# Patient Record
Sex: Female | Born: 1960 | Race: White | Hispanic: No | Smoking: Former smoker
Health system: Southern US, Community
[De-identification: ages and names within clinical notes are randomized; demographics above are authoritative.]

## PROBLEM LIST (undated history)

## (undated) DIAGNOSIS — F329 Major depressive disorder, single episode, unspecified: Secondary | ICD-10-CM

## (undated) DIAGNOSIS — G4730 Sleep apnea, unspecified: Secondary | ICD-10-CM

## (undated) DIAGNOSIS — E039 Hypothyroidism, unspecified: Secondary | ICD-10-CM

## (undated) DIAGNOSIS — F32A Depression, unspecified: Secondary | ICD-10-CM

## (undated) DIAGNOSIS — I10 Essential (primary) hypertension: Secondary | ICD-10-CM

## (undated) DIAGNOSIS — K219 Gastro-esophageal reflux disease without esophagitis: Secondary | ICD-10-CM

## (undated) DIAGNOSIS — E079 Disorder of thyroid, unspecified: Secondary | ICD-10-CM

## (undated) HISTORY — PX: OTHER SURGICAL HISTORY: SHX169

## (undated) HISTORY — PX: CHOLECYSTECTOMY: SHX55

## (undated) HISTORY — PX: TUBAL LIGATION: SHX77

## (undated) HISTORY — PX: CERVICAL FUSION: SHX112

---

## 2005-05-07 ENCOUNTER — Ambulatory Visit: Payer: Self-pay | Admitting: Orthopaedic Surgery

## 2006-06-06 ENCOUNTER — Emergency Department: Payer: Self-pay | Admitting: Emergency Medicine

## 2008-07-11 ENCOUNTER — Inpatient Hospital Stay: Payer: Self-pay | Admitting: General Surgery

## 2009-01-31 ENCOUNTER — Emergency Department: Payer: Self-pay | Admitting: Emergency Medicine

## 2009-09-04 ENCOUNTER — Ambulatory Visit: Payer: Self-pay

## 2009-09-18 ENCOUNTER — Ambulatory Visit: Payer: Self-pay | Admitting: Orthopedic Surgery

## 2009-09-24 ENCOUNTER — Ambulatory Visit: Payer: Self-pay | Admitting: Orthopedic Surgery

## 2009-10-04 ENCOUNTER — Ambulatory Visit: Payer: Self-pay | Admitting: Cardiovascular Disease

## 2011-03-31 ENCOUNTER — Emergency Department: Payer: Self-pay | Admitting: Emergency Medicine

## 2011-04-02 ENCOUNTER — Ambulatory Visit: Payer: Self-pay | Admitting: Orthopedic Surgery

## 2011-04-16 ENCOUNTER — Ambulatory Visit: Payer: Self-pay | Admitting: Unknown Physician Specialty

## 2011-04-16 LAB — HEMOGLOBIN: HGB: 15.1 g/dL (ref 12.0–16.0)

## 2011-04-16 LAB — BASIC METABOLIC PANEL
Anion Gap: 7 (ref 7–16)
Calcium, Total: 9.4 mg/dL (ref 8.5–10.1)
Creatinine: 0.79 mg/dL (ref 0.60–1.30)
EGFR (African American): >60
Glucose: 82 mg/dL (ref 65–99)
Potassium: 4.5 mmol/L (ref 3.5–5.1)

## 2011-04-28 ENCOUNTER — Ambulatory Visit: Payer: Self-pay | Admitting: Unknown Physician Specialty

## 2011-09-20 ENCOUNTER — Encounter (HOSPITAL_COMMUNITY): Payer: Self-pay | Admitting: *Deleted

## 2011-09-20 ENCOUNTER — Emergency Department (HOSPITAL_COMMUNITY)
Admission: EM | Admit: 2011-09-20 | Discharge: 2011-09-20 | Disposition: A | Discharge status: Home / Self Care | Payer: Private Health Insurance - Indemnity | Attending: Emergency Medicine | Admitting: Emergency Medicine

## 2011-09-20 DIAGNOSIS — Z981 Arthrodesis status: Secondary | ICD-10-CM | POA: Insufficient documentation

## 2011-09-20 DIAGNOSIS — R609 Edema, unspecified: Secondary | ICD-10-CM | POA: Insufficient documentation

## 2011-09-20 DIAGNOSIS — M79609 Pain in unspecified limb: Secondary | ICD-10-CM | POA: Insufficient documentation

## 2011-09-20 DIAGNOSIS — E079 Disorder of thyroid, unspecified: Secondary | ICD-10-CM | POA: Insufficient documentation

## 2011-09-20 DIAGNOSIS — R600 Localized edema: Secondary | ICD-10-CM

## 2011-09-20 DIAGNOSIS — F17200 Nicotine dependence, unspecified, uncomplicated: Secondary | ICD-10-CM | POA: Insufficient documentation

## 2011-09-20 HISTORY — DX: Disorder of thyroid, unspecified: E07.9

## 2011-09-20 LAB — POCT I-STAT, CHEM 8
BUN: 18 mg/dL (ref 6–23)
Calcium, Ion: 1.22 mmol/L (ref 1.12–1.23)
HCT: 44 % (ref 36.0–46.0)
Hemoglobin: 15 g/dL (ref 12.0–15.0)
Sodium: 141 mEq/L (ref 135–145)
TCO2: 24 mmol/L (ref 0–100)

## 2011-09-20 NOTE — ED Notes (Signed)
PT reports leg swelling new onset. Pt reports she stands for long hrs at her job and the swelling started on Friday. The swelling decreased while sleeping but returned soon after waking up this AM.

## 2011-09-20 NOTE — ED Provider Notes (Signed)
History  This chart was scribed for Ethelda Chick, MD by Ladona Ridgel Day. This patient was seen in room TR02C/TR02C and the patient's care was started at 1238.   CSN: [id number]  Arrival date & time 09/20/11  1238   None     Chief Complaint  Patient presents with  . Leg Swelling  . Leg Pain   The history is provided by the patient. No language interpreter was used.   Marihanna Betschart is a 51 y.o. female who presents to the Emergency Department complaining of intermittent gradually worsening BLE leg swelling/pain for 2 days. She states this is a new problem which began 2 days ago while she was standing for a long time at work and her BLE began swelling/pain. Her swelling decreased after sleep and returned after waking up this AM. She states sometimes has mild swelling but never this severe before. She denies SOB, kidney disease, no chest pain.  No recent travel or surgeries, no OCP use, no hx DVT/PE.  There are no other associated systemic symptoms, there are no other alleviating or modifying factors.   Her PCP is Dr. Kenard Gower  Past Medical History  Diagnosis Date  . Thyroid disease     Past Surgical History  Procedure Date  . Cervical fusion     april 2013  . Tubal ligation   . Arthoscopic knee     BIL    No family history on file.  History  Substance Use Topics  . Smoking status: Current Every Day Smoker    Types: Cigarettes  . Smokeless tobacco: Not on file  . Alcohol Use: No    OB History    Grav Para Term Preterm Abortions TAB SAB Ect Mult Living                  Review of Systems  Constitutional: Negative for fever and chills.  HENT: Negative for congestion.   Respiratory: Negative for cough and shortness of breath.   Cardiovascular: Positive for leg swelling. Negative for chest pain.  Gastrointestinal: Negative for nausea, vomiting and abdominal pain.  Musculoskeletal: Negative for back pain.  Skin: Negative for color change.  Neurological: Negative for  weakness.  All other systems reviewed and are negative.    Allergies  Review of patient's allergies indicates no known allergies.  Home Medications   Current Outpatient Rx  Name Route Sig Dispense Refill  . FP GLUCOSAMINE PO Oral Take 2 tablets by mouth daily.    . IBUPROFEN 800 MG PO TABS Oral Take 800 mg by mouth every 8 (eight) hours as needed. pain    . LEVOTHYROXINE SODIUM 112 MCG PO TABS Oral Take 112 mcg by mouth daily.      Triage Vitals: BP 130/84  Pulse 79  Temp 97.6 F (36.4 C) (Oral)  Resp 20  SpO2 99%  Physical Exam  Nursing note and vitals reviewed. Constitutional: She is oriented to person, place, and time. She appears well-developed and well-nourished. No distress.  HENT:  Head: Normocephalic and atraumatic.  Eyes: EOM are normal.  Neck: Neck supple. No tracheal deviation present.  Cardiovascular: Normal rate, regular rhythm and normal heart sounds.   No murmur heard. Pulmonary/Chest: Effort normal and breath sounds normal. No respiratory distress. She has no wheezes. She has no rales.  Musculoskeletal: Normal range of motion. She exhibits edema (BLE edema, non pitting, symetric, no calf tenderness).  Neurological: She is alert and oriented to person, place, and time.  Skin: Skin is  warm and dry.  Psychiatric: She has a normal mood and affect. Her behavior is normal.    ED Course  Procedures (including critical care time) DIAGNOSTIC STUDIES: Oxygen Saturation is 99% on room air, normal by my interpretation.    COORDINATION OF CARE: At 28 PM Discussed treatment plan with patient which includes blood work, and follow up with her PCP. Patient agrees.    Labs Reviewed  POCT I-STAT, CHEM 8  LAB REPORT - SCANNED   No results found.   1. Lower extremity edema       MDM  Pt with symmetric bilateral lower extremity nonpitting edema.  States it is worse with standing on her feet for long periods.  Renal function normal, no signs of CHF.  Doubt DVT  given symmetry and no other risk factors.  Advised pt for f/u with her PMD.  Discharged with strict return precautions.  Pt agreeable with plan.  I personally performed the services described in this documentation, which was scribed in my presence. The recorded information has been reviewed and considered.          Ethelda Chick, MD 09/23/11 [phone]

## 2011-10-15 ENCOUNTER — Emergency Department: Payer: Self-pay | Admitting: Emergency Medicine

## 2011-10-15 LAB — CBC
HCT: 41.5 % (ref 35.0–47.0)
MCHC: 33.4 g/dL (ref 32.0–36.0)
MCV: 94 fL (ref 80–100)
Platelet: 230 10*3/uL (ref 150–440)
RDW: 14.2 % (ref 11.5–14.5)

## 2011-10-15 LAB — CK TOTAL AND CKMB (NOT AT ARMC)
CK, Total: 232 U/L — ABNORMAL HIGH (ref 21–215)
CK-MB: 4.3 ng/mL — ABNORMAL HIGH (ref 0.5–3.6)

## 2011-10-15 LAB — COMPREHENSIVE METABOLIC PANEL
BUN: 17 mg/dL (ref 7–18)
Chloride: 109 mmol/L — ABNORMAL HIGH (ref 98–107)
Co2: 21 mmol/L (ref 21–32)
EGFR (African American): >60
EGFR (Non-African Amer.): >60
SGOT(AST): 26 U/L (ref 15–37)
SGPT (ALT): 26 U/L (ref 12–78)

## 2011-10-15 LAB — TROPONIN I
Troponin-I: <0.02 ng/mL
Troponin-I: <0.02 ng/mL

## 2012-08-09 ENCOUNTER — Emergency Department: Payer: Self-pay | Admitting: Emergency Medicine

## 2012-08-09 LAB — URINALYSIS, COMPLETE
Bilirubin,UR: NEGATIVE
Ketone: NEGATIVE
Ph: 6 (ref 4.5–8.0)
Protein: NEGATIVE
RBC,UR: 1 /HPF (ref 0–5)
Specific Gravity: 1.014 (ref 1.003–1.030)

## 2012-08-09 LAB — COMPREHENSIVE METABOLIC PANEL
Albumin: 3.4 g/dL (ref 3.4–5.0)
BUN: 30 mg/dL — ABNORMAL HIGH (ref 7–18)
Calcium, Total: 8.7 mg/dL (ref 8.5–10.1)
Creatinine: 0.95 mg/dL (ref 0.60–1.30)
Osmolality: 282 (ref 275–301)
SGOT(AST): 33 U/L (ref 15–37)
SGPT (ALT): 35 U/L (ref 12–78)

## 2012-08-09 LAB — CBC
HCT: 41.5 % (ref 35.0–47.0)
HGB: 14.3 g/dL (ref 12.0–16.0)
RBC: 4.44 10*6/uL (ref 3.80–5.20)
WBC: 10.4 10*3/uL (ref 3.6–11.0)

## 2012-10-17 ENCOUNTER — Ambulatory Visit: Payer: Self-pay | Admitting: Gastroenterology

## 2013-09-20 DIAGNOSIS — G4730 Sleep apnea, unspecified: Secondary | ICD-10-CM | POA: Insufficient documentation

## 2013-10-09 ENCOUNTER — Ambulatory Visit: Payer: Self-pay | Admitting: Family Medicine

## 2014-03-07 DIAGNOSIS — R600 Localized edema: Secondary | ICD-10-CM | POA: Insufficient documentation

## 2014-04-06 DIAGNOSIS — I10 Essential (primary) hypertension: Secondary | ICD-10-CM | POA: Insufficient documentation

## 2014-04-06 DIAGNOSIS — E039 Hypothyroidism, unspecified: Secondary | ICD-10-CM | POA: Insufficient documentation

## 2014-04-09 ENCOUNTER — Emergency Department
Admit: 2014-04-09 | Disposition: A | Discharge status: Home / Self Care | Payer: Self-pay | Admitting: Emergency Medicine

## 2014-04-09 LAB — BASIC METABOLIC PANEL
Anion Gap: 10 (ref 7–16)
BUN: 26 mg/dL — AB
CALCIUM: 9.6 mg/dL
CHLORIDE: 102 mmol/L
CO2: 24 mmol/L
CREATININE: 0.97 mg/dL
EGFR (Non-African Amer.): >60
GLUCOSE: 97 mg/dL
Potassium: 3.7 mmol/L
SODIUM: 136 mmol/L

## 2014-04-09 LAB — TROPONIN I

## 2014-04-09 LAB — CBC
HCT: 39.2 % (ref 35.0–47.0)
HGB: 12.9 g/dL (ref 12.0–16.0)
MCH: 31.7 pg (ref 26.0–34.0)
MCHC: 32.9 g/dL (ref 32.0–36.0)
MCV: 96 fL (ref 80–100)
Platelet: 231 10*3/uL (ref 150–440)
RBC: 4.06 10*6/uL (ref 3.80–5.20)
RDW: 13.9 % (ref 11.5–14.5)
WBC: 8.8 10*3/uL (ref 3.6–11.0)

## 2014-04-10 DIAGNOSIS — R079 Chest pain, unspecified: Secondary | ICD-10-CM | POA: Insufficient documentation

## 2014-04-29 NOTE — Op Note (Signed)
PATIENT NAME:  Allison Paul, HEPLER MR#:  [id number] DATE OF BIRTH:  03-24-1960  DATE OF PROCEDURE:  04/28/2011  PREOPERATIVE DIAGNOSIS: Cervical spondylosis with radiculopathy, right arm C5-C6.   POSTOPERATIVE DIAGNOSIS: Cervical spondylosis with radiculopathy, right arm C5-C6.   PROCEDURES: 1. Anterior cervical diskectomy and fusion, C5-C6 with removal of posterior osteophytes and posterior disk material.  2. Application of interbody device, C5-C6.  3. Application of anterior plate C5-C6.   SURGEON: Alysia Penna. Mauri Pole, MD  ASSISTANT: Rachelle Hora, PA-C  ANESTHESIA: General.   ESTIMATED BLOOD LOSS: 25 mL.   REPLACEMENT: 1500 mL of crystalloid.   DRAINS: One Hemovac.   COMPLICATIONS: None.   IMPLANTS USED: Zimmer trabecular metal angled 6 x 11 x 11 mm implant, 22 mm Trinica plate, 12 mm screws, CopiOs bone void filler.   BRIEF CLINICAL NOTE AND PATHOLOGY: The patient had a documented large cervical herniated disk with osteophytes and spondylosis, C6 radiculopathy with weakness. She had some improvement with conservative treatment but had continued persistent pain and weakness. Options, risks, and benefits were discussed and she elected to proceed with operative intervention. At time of the procedure there was a very large posterior fragment. Patient's bone was of good quality.   DESCRIPTION OF PROCEDURE: Preop antibiotics, adequate general anesthesia, patient was on the radiolucent table, head halter traction used, roll placed beneath the cervical spine. Shoulder positioner was used. AP and lateral fluoroscopic views were checked. The skin was marked and the routine left-sided incision was made. The subcutaneous tissues were dissected, the interval carefully developed down to the anterior cervical spine with the fascia being released. The self-retaining retractor was placed. It was repositioned frequently throughout the procedure.   Position was checked with lateral fluoroscopy and the  distraction pins were placed in the C6 and C7 vertebral bodies. The microscope was then brought onto the field and the anterior annulus was excised followed by the diskectomy. This was completed to the posterior disk space and posterior osteophytes were removed. The endplates were scraped and the bur was used. The posterior osteophytes again checked with the rongeur. Sizing was performed after the lateral gutters were checked. The posterior longitudinal ligament had been removed. Incision was thoroughly irrigated. The size 5 and size 6 spacers were trialed. This resulted in a size 6. It was then inserted after being packed with CopiOs. Surgiflo was placed prior to this. AP and lateral views showed good positioning. The anterior plate was then applied, screws had excellent purchase. The locking mechanisms were engaged. AP and lateral views showed good positioning. Hemostasis was good. Soft tissue was without any obvious damage.   The incision was thoroughly irrigated. The sub-Q was closed with 2-0 and 4-0 Vicryl. Skin closed with skin glue. Soft sterile dressing was applied. Hemovac had been applied. Sponge and needle counts were reported as correct prior to and after wound closure. The patient was awakened, taken to the PAC-U having tolerated the procedure well.   ____________________________ Alysia Penna. Mauri Pole, MD jcc:cms D: 04/28/2011 17:45:33 ET T: 04/29/2011 07:38:04 ET JOB#: [id number]  cc: Alysia Penna. Mauri Pole, MD, <Dictator> Alysia Penna Brieann Osinski MD ELECTRONICALLY SIGNED 04/30/2011 9:11

## 2014-08-12 DIAGNOSIS — N183 Chronic kidney disease, stage 3 unspecified: Secondary | ICD-10-CM | POA: Insufficient documentation

## 2014-08-23 ENCOUNTER — Other Ambulatory Visit: Payer: Self-pay | Admitting: Unknown Physician Specialty

## 2014-08-23 DIAGNOSIS — M5417 Radiculopathy, lumbosacral region: Secondary | ICD-10-CM

## 2014-08-24 ENCOUNTER — Ambulatory Visit
Admission: RE | Admit: 2014-08-24 | Discharge: 2014-08-24 | Disposition: A | Discharge status: Home / Self Care | Payer: Managed Care, Other (non HMO) | Source: Ambulatory Visit | Attending: Unknown Physician Specialty | Admitting: Unknown Physician Specialty

## 2014-08-24 DIAGNOSIS — M5417 Radiculopathy, lumbosacral region: Secondary | ICD-10-CM

## 2014-08-28 ENCOUNTER — Ambulatory Visit: Admission: RE | Admit: 2014-08-28 | Payer: Managed Care, Other (non HMO) | Source: Ambulatory Visit

## 2014-08-30 ENCOUNTER — Ambulatory Visit
Admission: RE | Admit: 2014-08-30 | Discharge: 2014-08-30 | Disposition: A | Discharge status: Home / Self Care | Payer: Managed Care, Other (non HMO) | Source: Ambulatory Visit | Attending: Unknown Physician Specialty | Admitting: Unknown Physician Specialty

## 2014-08-30 DIAGNOSIS — M5417 Radiculopathy, lumbosacral region: Secondary | ICD-10-CM | POA: Insufficient documentation

## 2014-09-06 DIAGNOSIS — M545 Low back pain, unspecified: Secondary | ICD-10-CM | POA: Insufficient documentation

## 2014-11-01 DIAGNOSIS — M5126 Other intervertebral disc displacement, lumbar region: Secondary | ICD-10-CM | POA: Insufficient documentation

## 2014-11-01 DIAGNOSIS — M5136 Other intervertebral disc degeneration, lumbar region: Secondary | ICD-10-CM | POA: Insufficient documentation

## 2014-11-01 DIAGNOSIS — M5417 Radiculopathy, lumbosacral region: Secondary | ICD-10-CM | POA: Insufficient documentation

## 2014-12-18 ENCOUNTER — Emergency Department: Payer: Managed Care, Other (non HMO)

## 2014-12-18 ENCOUNTER — Encounter: Payer: Self-pay | Admitting: Emergency Medicine

## 2014-12-18 ENCOUNTER — Emergency Department
Admission: EM | Admit: 2014-12-18 | Discharge: 2014-12-18 | Disposition: A | Discharge status: Home / Self Care | Payer: Managed Care, Other (non HMO) | Attending: Emergency Medicine | Admitting: Emergency Medicine

## 2014-12-18 DIAGNOSIS — R0600 Dyspnea, unspecified: Secondary | ICD-10-CM | POA: Insufficient documentation

## 2014-12-18 DIAGNOSIS — F17210 Nicotine dependence, cigarettes, uncomplicated: Secondary | ICD-10-CM | POA: Insufficient documentation

## 2014-12-18 DIAGNOSIS — I10 Essential (primary) hypertension: Secondary | ICD-10-CM | POA: Diagnosis not present

## 2014-12-18 DIAGNOSIS — R0789 Other chest pain: Secondary | ICD-10-CM | POA: Diagnosis not present

## 2014-12-18 DIAGNOSIS — R0602 Shortness of breath: Secondary | ICD-10-CM | POA: Insufficient documentation

## 2014-12-18 DIAGNOSIS — Z79899 Other long term (current) drug therapy: Secondary | ICD-10-CM | POA: Insufficient documentation

## 2014-12-18 DIAGNOSIS — R05 Cough: Secondary | ICD-10-CM | POA: Diagnosis not present

## 2014-12-18 DIAGNOSIS — R062 Wheezing: Secondary | ICD-10-CM

## 2014-12-18 HISTORY — DX: Essential (primary) hypertension: I10

## 2014-12-18 LAB — CBC
HEMATOCRIT: 43.9 % (ref 35.0–47.0)
Hemoglobin: 14.5 g/dL (ref 12.0–16.0)
MCH: 31.2 pg (ref 26.0–34.0)
MCHC: 33 g/dL (ref 32.0–36.0)
MCV: 94.4 fL (ref 80.0–100.0)
PLATELETS: 240 10*3/uL (ref 150–440)
RBC: 4.65 MIL/uL (ref 3.80–5.20)
RDW: 13.8 % (ref 11.5–14.5)
WBC: 8.6 10*3/uL (ref 3.6–11.0)

## 2014-12-18 LAB — BASIC METABOLIC PANEL
Anion gap: 14 (ref 5–15)
BUN: 30 mg/dL — ABNORMAL HIGH (ref 6–20)
CALCIUM: 9.4 mg/dL (ref 8.9–10.3)
CO2: 21 mmol/L — AB (ref 22–32)
CREATININE: 1.64 mg/dL — AB (ref 0.44–1.00)
Chloride: 102 mmol/L (ref 101–111)
GFR calc Af Amer: 40 mL/min — ABNORMAL LOW (ref >60)
GFR calc non Af Amer: 34 mL/min — ABNORMAL LOW (ref >60)
GLUCOSE: 96 mg/dL (ref 65–99)
Potassium: 4.3 mmol/L (ref 3.5–5.1)
Sodium: 137 mmol/L (ref 135–145)

## 2014-12-18 LAB — TROPONIN I: Troponin I: <0.03 ng/mL (ref <0.031)

## 2014-12-18 MED ORDER — PREDNISONE 20 MG PO TABS
60.0000 mg | ORAL_TABLET | Freq: Once | ORAL | Status: AC
  Administered 2014-12-18: 60 mg via ORAL
  Filled 2014-12-18: qty 3

## 2014-12-18 MED ORDER — IPRATROPIUM-ALBUTEROL 0.5-2.5 (3) MG/3ML IN SOLN
3.0000 mL | Freq: Once | RESPIRATORY_TRACT | Status: AC
Start: 2014-12-18 — End: 2014-12-18
  Administered 2014-12-18: 3 mL via RESPIRATORY_TRACT
  Filled 2014-12-18: qty 3

## 2014-12-18 MED ORDER — IPRATROPIUM-ALBUTEROL 0.5-2.5 (3) MG/3ML IN SOLN
3.0000 mL | Freq: Once | RESPIRATORY_TRACT | Status: AC
  Administered 2014-12-18: 3 mL via RESPIRATORY_TRACT
  Filled 2014-12-18 (×2): qty 3

## 2014-12-18 MED ORDER — IPRATROPIUM-ALBUTEROL 0.5-2.5 (3) MG/3ML IN SOLN
RESPIRATORY_TRACT | Status: AC
  Administered 2014-12-18: 3 mL via RESPIRATORY_TRACT
  Filled 2014-12-18: qty 3

## 2014-12-18 MED ORDER — ALBUTEROL SULFATE HFA 108 (90 BASE) MCG/ACT IN AERS: 2.0000 | INHALATION_SPRAY | Freq: Four times a day (QID) | RESPIRATORY_TRACT | Status: DC | PRN

## 2014-12-18 MED ORDER — PREDNISONE 20 MG PO TABS: 40.0000 mg | ORAL_TABLET | Freq: Every day | ORAL | Status: DC

## 2014-12-18 NOTE — ED Notes (Signed)
Pt alert and oriented X4, active, cooperative, pt in NAD. RR even and unlabored, color WNL.  Pt informed to return if any life threatening symptoms occur.   

## 2014-12-18 NOTE — Discharge Instructions (Signed)
Please seek medical attention for any high fevers, chest pain, shortness of breath, change in behavior, persistent vomiting, bloody stool or any other new or concerning symptoms. ° ° °Shortness of Breath °Shortness of breath means you have trouble breathing. It could also mean that you have a medical problem. You should get immediate medical care for shortness of breath. °CAUSES  °· Not enough oxygen in the air such as with high altitudes or a smoke-filled room. °· Certain lung diseases, infections, or problems. °· Heart disease or conditions, such as angina or heart failure. °· Low red blood cells (anemia). °· Poor physical fitness, which can cause shortness of breath when you exercise. °· Chest or back injuries or stiffness. °· Being overweight. °· Smoking. °· Anxiety, which can make you feel like you are not getting enough air. °DIAGNOSIS  °Serious medical problems can often be found during your physical exam. Tests may also be done to determine why you are having shortness of breath. Tests may include: °· Chest X-rays. °· Lung function tests. °· Blood tests. °· An electrocardiogram (ECG). °· An ambulatory electrocardiogram. An ambulatory ECG records your heartbeat patterns over a 24-hour period. °· Exercise testing. °· A transthoracic echocardiogram (TTE). During echocardiography, sound waves are used to evaluate how blood flows through your heart. °· A transesophageal echocardiogram (TEE). °· Imaging scans. °Your health care provider may not be able to find a cause for your shortness of breath after your exam. In this case, it is important to have a follow-up exam with your health care provider as directed.  °TREATMENT  °Treatment for shortness of breath depends on the cause of your symptoms and can vary greatly. °HOME CARE INSTRUCTIONS  °· Do not smoke. Smoking is a common cause of shortness of breath. If you smoke, ask for help to quit. °· Avoid being around chemicals or things that may bother your breathing,  such as paint fumes and dust. °· Rest as needed. Slowly resume your usual activities. °· If medicines were prescribed, take them as directed for the full length of time directed. This includes oxygen and any inhaled medicines. °· Keep all follow-up appointments as directed by your health care provider. °SEEK MEDICAL CARE IF:  °· Your condition does not improve in the time expected. °· You have a hard time doing your normal activities even with rest. °· You have any new symptoms. °SEEK IMMEDIATE MEDICAL CARE IF:  °· Your shortness of breath gets worse. °· You feel light-headed, faint, or develop a cough not controlled with medicines. °· You start coughing up blood. °· You have pain with breathing. °· You have chest pain or pain in your arms, shoulders, or abdomen. °· You have a fever. °· You are unable to walk up stairs or exercise the way you normally do. °MAKE SURE YOU: °· Understand these instructions. °· Will watch your condition. °· Will get help right away if you are not doing well or get worse. °  °This information is not intended to replace advice given to you by your health care provider. Make sure you discuss any questions you have with your health care provider. °  °Document Released: 09/16/2000 Document Revised: 12/27/2012 Document Reviewed: 03/09/2011 °Elsevier Interactive Patient Education ©2016 [employer] ° °

## 2014-12-18 NOTE — ED Notes (Signed)
Pt presents with shortness of breath and chest pain since last Thursday, was seen by doctor and started on amoxicillin two days ago with no improvement. Pt stats hurts worse when taking a deep breath in.

## 2014-12-18 NOTE — ED Provider Notes (Signed)
[facility] Emergency Department Provider Note    ____________________________________________  Time seen: 1530  I have reviewed the triage vital signs and the nursing notes.   HISTORY  Chief Complaint Shortness of Breath and Chest Pain   History limited by: Not Limited   HPI Allison Paul is a 54 y.o. female who presents to the emergency department today because of concerns for shortness of breath. The patient states she has been having problems with shortness of breath and cough for the past 5 days. She states it is gradually gotten worse. It is associated with some chest tightness. She states she feels like she needs to bring something up however her cough is not sufficient for that. She went to [facility] yesterday was given amoxicillin. She states that this is not helped. She denies any recent travel. Denies being on any estrogen. Denies history of DVTs.   Past Medical History  Diagnosis Date  . Thyroid disease   . Hypertension     There are no active problems to display for this patient.   Past Surgical History  Procedure Laterality Date  . Cervical fusion      april 2013  . Tubal ligation    . Arthoscopic knee      BIL    Current Outpatient Rx  Name  Route  Sig  Dispense  Refill  . Glucosamine Sulfate (FP GLUCOSAMINE PO)   Oral   Take 2 tablets by mouth daily.         Marland Kitchen levothyroxine (SYNTHROID, LEVOTHROID) 112 MCG tablet   Oral   Take 112 mcg by mouth daily.         . metoprolol (LOPRESSOR) 50 MG tablet   Oral   Take 50 mg by mouth every morning.         Marland Kitchen ibuprofen (ADVIL,MOTRIN) 800 MG tablet   Oral   Take 800 mg by mouth every 8 (eight) hours as needed. pain           Allergies Review of patient's allergies indicates no known allergies.  No family history on file.  Social History Social History  Substance Use Topics  . Smoking status: Current Every Day Smoker    Types: Cigarettes  . Smokeless  tobacco: None  . Alcohol Use: No    Review of Systems  Constitutional: Negative for fever. Cardiovascular: Negative for chest pain. Positive for chest tightness Respiratory: Positive for shortness of breath. Gastrointestinal: Negative for abdominal pain, vomiting and diarrhea. Genitourinary: Negative for dysuria. Musculoskeletal: Negative for back pain. Skin: Negative for rash. Neurological: Negative for headaches, focal weakness or numbness.   10-point ROS otherwise negative.  ____________________________________________   PHYSICAL EXAM:  VITAL SIGNS: ED Triage Vitals  Enc Vitals Group     BP 12/18/14 1221 136/56 mmHg     Pulse Rate 12/18/14 1221 82     Resp 12/18/14 1221 22     Temp 12/18/14 1221 98.2 F (36.8 C)     Temp Source 12/18/14 1221 Oral     SpO2 12/18/14 1221 96 %     Weight 12/18/14 1221 270 lb (122.471 kg)     Height 12/18/14 1221 5\' 3"  (1.6 m)     Head Cir --      Peak Flow --      Pain Score 12/18/14 1222 4   Constitutional: Alert and oriented. Mild respiratory distress Eyes: Conjunctivae are normal. PERRL. Normal extraocular movements. ENT   Head: Normocephalic and atraumatic.   Nose:  No congestion/rhinnorhea.   Mouth/Throat: Mucous membranes are moist.   Neck: No stridor. Hematological/Lymphatic/Immunilogical: No cervical lymphadenopathy. Cardiovascular: Normal rate, regular rhythm.  No murmurs, rubs, or gallops. Respiratory: Mild respiratory distress with slightly increased respiratory effort. Diffuse and bilateral expiratory wheezing. Gastrointestinal: Soft and nontender. No distention.  Genitourinary: Deferred Musculoskeletal: Normal range of motion in all extremities. No joint effusions.  No lower extremity tenderness nor edema. Neurologic:  Normal speech and language. No gross focal neurologic deficits are appreciated.  Skin:  Skin is warm, dry and intact. No rash noted. Psychiatric: Mood and affect are normal. Speech and  behavior are normal. Patient exhibits appropriate insight and judgment.  ____________________________________________    LABS (pertinent positives/negatives)  Labs Reviewed  BASIC METABOLIC PANEL - Abnormal; Notable for the following:    CO2 21 (*)    BUN 30 (*)    Creatinine, Ser 1.64 (*)    GFR calc non Af Amer 34 (*)    GFR calc Af Amer 40 (*)    All other components within normal limits  TROPONIN I  CBC     ____________________________________________   EKG  I, Nance Pear, attending physician, personally viewed and interpreted this EKG  EKG Time: 1220 Rate: 82 Rhythm: normal sinus rhythm Axis: left axis deviation Intervals: qtc 469 QRS: narrow ST changes: no st elevation Impression: LAD, otherwise normal ekg   ____________________________________________    RADIOLOGY  CXR IMPRESSION: 1. Diffuse interstitial prominence of uncertain chronicity. No focal infiltrate.  I, Doris Mcgilvery, personally viewed and evaluated these images (plain radiographs) as part of my medical decision making. ____________________________________________   PROCEDURES  Procedure(s) performed: None  Critical Care performed: No  ____________________________________________   INITIAL IMPRESSION / ASSESSMENT AND PLAN / ED COURSE  Pertinent labs & imaging results that were available during my care of the patient were reviewed by me and considered in my medical decision making (see chart for details).  Patient presented to the emergency department today with concerns for shortness breath and cough. On exam she had diffuse wheezing. Patient did feel better after DuoNeb treatments and steroid course. Patient does have a history of smoking. I think likely this time patient some aspect of bronchitis. Will discharge with albuterol inhaler and steroid course.  ____________________________________________   FINAL CLINICAL IMPRESSION(S) / ED DIAGNOSES  Final diagnoses:   Shortness of breath  Wheezing     Nance Pear, MD 12/18/14 1657

## 2015-02-03 ENCOUNTER — Encounter: Payer: Self-pay | Admitting: Emergency Medicine

## 2015-02-03 ENCOUNTER — Emergency Department: Payer: Managed Care, Other (non HMO)

## 2015-02-03 ENCOUNTER — Emergency Department
Admission: EM | Admit: 2015-02-03 | Discharge: 2015-02-03 | Disposition: A | Discharge status: Home / Self Care | Payer: Managed Care, Other (non HMO) | Attending: Emergency Medicine | Admitting: Emergency Medicine

## 2015-02-03 DIAGNOSIS — Z87891 Personal history of nicotine dependence: Secondary | ICD-10-CM | POA: Insufficient documentation

## 2015-02-03 DIAGNOSIS — F410 Panic disorder [episodic paroxysmal anxiety] without agoraphobia: Secondary | ICD-10-CM | POA: Insufficient documentation

## 2015-02-03 DIAGNOSIS — F419 Anxiety disorder, unspecified: Secondary | ICD-10-CM | POA: Insufficient documentation

## 2015-02-03 DIAGNOSIS — Z79899 Other long term (current) drug therapy: Secondary | ICD-10-CM | POA: Insufficient documentation

## 2015-02-03 DIAGNOSIS — Z7952 Long term (current) use of systemic steroids: Secondary | ICD-10-CM | POA: Diagnosis not present

## 2015-02-03 DIAGNOSIS — I10 Essential (primary) hypertension: Secondary | ICD-10-CM | POA: Insufficient documentation

## 2015-02-03 DIAGNOSIS — R079 Chest pain, unspecified: Secondary | ICD-10-CM | POA: Diagnosis present

## 2015-02-03 LAB — CBC WITH DIFFERENTIAL/PLATELET
Basophils Absolute: 0.1 10*3/uL (ref 0–0.1)
Basophils Relative: 1 %
Eosinophils Absolute: 0 10*3/uL (ref 0–0.7)
Eosinophils Relative: 1 %
HEMATOCRIT: 42.7 % (ref 35.0–47.0)
HEMOGLOBIN: 14.5 g/dL (ref 12.0–16.0)
LYMPHS ABS: 2.2 10*3/uL (ref 1.0–3.6)
LYMPHS PCT: 27 %
MCH: 31.4 pg (ref 26.0–34.0)
MCHC: 33.9 g/dL (ref 32.0–36.0)
MCV: 92.4 fL (ref 80.0–100.0)
MONOS PCT: 10 %
Monocytes Absolute: 0.8 10*3/uL (ref 0.2–0.9)
NEUTROS ABS: 5.2 10*3/uL (ref 1.4–6.5)
NEUTROS PCT: 61 %
Platelets: 240 10*3/uL (ref 150–440)
RBC: 4.62 MIL/uL (ref 3.80–5.20)
RDW: 14.2 % (ref 11.5–14.5)
WBC: 8.4 10*3/uL (ref 3.6–11.0)

## 2015-02-03 LAB — BASIC METABOLIC PANEL
Anion gap: 10 (ref 5–15)
BUN: 20 mg/dL (ref 6–20)
CHLORIDE: 105 mmol/L (ref 101–111)
CO2: 24 mmol/L (ref 22–32)
Calcium: 9.8 mg/dL (ref 8.9–10.3)
Creatinine, Ser: 0.95 mg/dL (ref 0.44–1.00)
GFR calc Af Amer: >60 mL/min (ref >60)
GFR calc non Af Amer: >60 mL/min (ref >60)
GLUCOSE: 98 mg/dL (ref 65–99)
POTASSIUM: 3.6 mmol/L (ref 3.5–5.1)
SODIUM: 139 mmol/L (ref 135–145)

## 2015-02-03 LAB — TROPONIN I

## 2015-02-03 LAB — TSH: TSH: 3.554 u[IU]/mL (ref 0.350–4.500)

## 2015-02-03 MED ORDER — ALPRAZOLAM 0.5 MG PO TABS
1.0000 mg | ORAL_TABLET | Freq: Once | ORAL | Status: AC
  Administered 2015-02-03: 1 mg via ORAL

## 2015-02-03 MED ORDER — ALPRAZOLAM 1 MG PO TABS: 1.0000 mg | ORAL_TABLET | Freq: Three times a day (TID) | ORAL | Status: AC | PRN

## 2015-02-03 MED ORDER — ALPRAZOLAM 0.5 MG PO TABS
ORAL_TABLET | ORAL | Status: AC
  Administered 2015-02-03: 1 mg via ORAL
  Filled 2015-02-03: qty 2

## 2015-02-03 MED ORDER — DIAZEPAM 5 MG/ML IJ SOLN
5.0000 mg | Freq: Once | INTRAMUSCULAR | Status: AC
  Administered 2015-02-03: 5 mg via INTRAVENOUS
  Filled 2015-02-03: qty 2

## 2015-02-03 MED ORDER — SODIUM CHLORIDE 0.9 % IV BOLUS (SEPSIS)
1000.0000 mL | Freq: Once | INTRAVENOUS | Status: AC
  Administered 2015-02-03: 1000 mL via INTRAVENOUS

## 2015-02-03 NOTE — ED Provider Notes (Signed)
[facility] Emergency Department Provider Note  ____________________________________________  Time seen: Approximately 9:10 AM  I have reviewed the triage vital signs and the nursing notes.   HISTORY  Chief Complaint Chest Pain and Panic Attack    HPI Allison Paul is a 55 y.o. female with a history of panic attackswho is presenting today with anxiety, shortness of breath and chest pain over the past 24 hours. She says that she has been very stressed recently due to issues with her son as well as stressful situations at work. She says that yesterday she began feeling panicked and then this has since worsened overnight. She then began having mild pressure-like chest pain to the middle chest with a radiation to the left chest. She says that she has had multiple panic attacks in the past and that they feel similar to how she feels today including the chest pain or shortness of breath. She has also recently had a sinus infection with a runny nose and cough and was prescribed Augmentin as well as prednisone this past Friday. She says that the panicked feelings started soon after beginning the prednisone.   Past Medical History  Diagnosis Date  . Thyroid disease   . Hypertension     There are no active problems to display for this patient.   Past Surgical History  Procedure Laterality Date  . Cervical fusion      april 2013  . Tubal ligation    . Arthoscopic knee      BIL  . Cholecystectomy      Current Outpatient Rx  Name  Route  Sig  Dispense  Refill  . albuterol (PROVENTIL HFA;VENTOLIN HFA) 108 (90 BASE) MCG/ACT inhaler   Inhalation   Inhale 2 puffs into the lungs every 6 (six) hours as needed for wheezing or shortness of breath.   1 Inhaler   0   . Glucosamine Sulfate (FP GLUCOSAMINE PO)   Oral   Take 2 tablets by mouth daily.         Marland Kitchen ibuprofen (ADVIL,MOTRIN) 800 MG tablet   Oral   Take 800 mg by mouth every 8 (eight) hours as  needed. pain         . levothyroxine (SYNTHROID, LEVOTHROID) 112 MCG tablet   Oral   Take 112 mcg by mouth daily.         . metoprolol (LOPRESSOR) 50 MG tablet   Oral   Take 50 mg by mouth every morning.         . predniSONE (DELTASONE) 20 MG tablet   Oral   Take 2 tablets (40 mg total) by mouth daily.   8 tablet   0     Allergies Review of patient's allergies indicates no known allergies.  History reviewed. No pertinent family history.  Social History Social History  Substance Use Topics  . Smoking status: Former Smoker    Types: Cigarettes    Quit date: 12/06/2014  . Smokeless tobacco: None  . Alcohol Use: Yes     Comment: Occassionally    Review of Systems Constitutional: No fever/chills Eyes: No visual changes. ENT: No sore throat. Cardiovascular: As above Respiratory: As above Gastrointestinal: No abdominal pain.  No nausea, no vomiting.  No diarrhea.  No constipation. Genitourinary: Negative for dysuria. Musculoskeletal: Negative for back pain. Skin: Negative for rash. Neurological: Negative for headaches, focal weakness or numbness.  10-point ROS otherwise negative.  ____________________________________________   PHYSICAL EXAM:  VITAL SIGNS: ED Triage Vitals  Enc Vitals Group     BP 02/03/15 0914 156/103 mmHg     Pulse Rate 02/03/15 0914 84     Resp 02/03/15 0914 18     Temp 02/03/15 0914 97.7 F (36.5 C)     Temp Source 02/03/15 0914 Oral     SpO2 02/03/15 0914 98 %     Weight 02/03/15 0914 265 lb (120.203 kg)     Height 02/03/15 0914 5\' 3"  (1.6 m)     Head Cir --      Peak Flow --      Pain Score 02/03/15 0915 5     Pain Loc --      Pain Edu? --      Excl. in [place]? --     Constitutional: Alert and oriented. Mild to moderate distress. Healing respirations. Tearful. Appears very anxious. Eyes: Conjunctivae are normal. PERRL. EOMI. Head: Atraumatic. Nose: No congestion/rhinnorhea. Mouth/Throat: Mucous membranes are moist.   Oropharynx non-erythematous. Neck: No stridor.   Cardiovascular: Normal rate, regular rhythm. Grossly normal heart sounds.  Good peripheral circulation. Bilateral and equal radial as well as dorsalis pedis pulses. Respiratory: Normal respiratory effort.  No retractions. Lungs CTAB. Gastrointestinal: Soft and nontender. No distention. No abdominal bruits. No CVA tenderness. Musculoskeletal: No lower extremity tenderness nor edema.  No joint effusions. Neurologic:  Normal speech and language. No gross focal neurologic deficits are appreciated. No gait instability. Skin:  Skin is warm, dry and intact. No rash noted. Psychiatric: Anxious mood with mildly pressured speech.  ____________________________________________   LABS (all labs ordered are listed, but only abnormal results are displayed)  Labs Reviewed  CBC WITH DIFFERENTIAL/PLATELET  BASIC METABOLIC PANEL  TROPONIN I  TSH  TROPONIN I   ____________________________________________  EKG  ED ECG REPORT I, Dionne Rossa,  Youlanda Roys, the attending physician, personally viewed and interpreted this ECG.   Date: 02/03/2015  EKG Time: 9:12 AM  Rate: 88  Rhythm: normal sinus rhythm  Axis: Normal axis  Intervals:Borderline short PR.  ST&T Change: No ST segment elevation or depression. No abnormal T-wave inversions.  ____________________________________________  RADIOLOGY  Stable interstitial changes without acute disease. ____________________________________________   PROCEDURES    ____________________________________________   INITIAL IMPRESSION / ASSESSMENT AND PLAN / ED COURSE  Pertinent labs & imaging results that were available during my care of the patient were reviewed by me and considered in my medical decision making (see chart for details).  ----------------------------------------- 11:41 AM on 02/03/2015 -----------------------------------------  Patient along with chest pain after Valium but still feeling  anxious. We'll give 1 mg of Xanax. Recheck troponin. Denying any suicidal or homicidal ideation.  ----------------------------------------- 2:18 PM on 02/03/2015 -----------------------------------------  Patient resting comfortably at this time. No further anxiety or panic after Xanax. 2 troponins are both negative. Denies any pain at this time. Symptoms consistent with past panic attacks. Very reassuring workup. We'll discharge home with several days of Xanax. Patient knows to stop her prednisone as this may have precipitated her anxiety. We'll follow up with her primary care doctor. She understands the plan is one to comply. We also discussed her testing results in detail. ____________________________________________   FINAL CLINICAL IMPRESSION(S) / ED DIAGNOSES  Panic attack. Chest pain.    Orbie Pyo, MD 02/03/15 [phone]

## 2015-02-03 NOTE — Discharge Instructions (Signed)
Stop taking your prednisone immediately as this may have been the cause of your increased anxiety and panic attack. Panic Attacks Panic attacks are sudden, short-livedsurges of severe anxiety, fear, or discomfort. They may occur for no reason when you are relaxed, when you are anxious, or when you are sleeping. Panic attacks may occur for a number of reasons:   Healthy people occasionally have panic attacks in extreme, life-threatening situations, such as war or natural disasters. Normal anxiety is a protective mechanism of the body that helps [place] react to danger (fight or flight response).  Panic attacks are often seen with anxiety disorders, such as panic disorder, social anxiety disorder, generalized anxiety disorder, and phobias. Anxiety disorders cause excessive or uncontrollable anxiety. They may interfere with your relationships or other life activities.  Panic attacks are sometimes seen with other mental illnesses, such as depression and posttraumatic stress disorder.  Certain medical conditions, prescription medicines, and drugs of abuse can cause panic attacks. SYMPTOMS  Panic attacks start suddenly, peak within 20 minutes, and are accompanied by four or more of the following symptoms:  Pounding heart or fast heart rate (palpitations).  Sweating.  Trembling or shaking.  Shortness of breath or feeling smothered.  Feeling choked.  Chest pain or discomfort.  Nausea or strange feeling in your stomach.  Dizziness, light-headedness, or feeling like you will faint.  Chills or hot flushes.  Numbness or tingling in your lips or hands and feet.  Feeling that things are not real or feeling that you are not yourself.  Fear of losing control or going crazy.  Fear of dying. Some of these symptoms can mimic serious medical conditions. For example, you may think you are having a heart attack. Although panic attacks can be very scary, they are not life threatening. DIAGNOSIS  Panic  attacks are diagnosed through an assessment by your health care provider. Your health care provider will ask questions about your symptoms, such as where and when they occurred. Your health care provider will also ask about your medical history and use of alcohol and drugs, including prescription medicines. Your health care provider may order blood tests or other studies to rule out a serious medical condition. Your health care provider may refer you to a mental health professional for further evaluation. TREATMENT   Most healthy people who have one or two panic attacks in an extreme, life-threatening situation will not require treatment.  The treatment for panic attacks associated with anxiety disorders or other mental illness typically involves counseling with a mental health professional, medicine, or a combination of both. Your health care provider will help determine what treatment is best for you.  Panic attacks due to physical illness usually go away with treatment of the illness. If prescription medicine is causing panic attacks, talk with your health care provider about stopping the medicine, decreasing the dose, or substituting another medicine.  Panic attacks due to alcohol or drug abuse go away with abstinence. Some adults need professional help in order to stop drinking or using drugs. HOME CARE INSTRUCTIONS   Take all medicines as directed by your health care provider.   Schedule and attend follow-up visits as directed by your health care provider. It is important to keep all your appointments. SEEK MEDICAL CARE IF:  You are not able to take your medicines as prescribed.  Your symptoms do not improve or get worse. SEEK IMMEDIATE MEDICAL CARE IF:   You experience panic attack symptoms that are different than your usual symptoms.  You have serious thoughts about hurting yourself or others.  You are taking medicine for panic attacks and have a serious side effect. MAKE SURE  YOU:  Understand these instructions.  Will watch your condition.  Will get help right away if you are not doing well or get worse.   This information is not intended to replace advice given to you by your health care provider. Make sure you discuss any questions you have with your health care provider.   Document Released: 12/22/2004 Document Revised: 12/27/2012 Document Reviewed: 08/05/2012 Elsevier Interactive Patient Education [employer].

## 2015-02-03 NOTE — ED Notes (Signed)
Pt presents to ED with c/o panic attack and chest pain. Pt states the panic attack began last night and has been intermittent since last night. Pt states external pressures at this time. Pt also states she began taking prednisone on Friday. Pt states on the way to the hospital, pt is accompanied by her son, she began having substernal chest pain that radiates to the L side.

## 2015-02-03 NOTE — ED Notes (Signed)
Pt ambulated to the bathroom without difficulty at this time. NAD noted. Pt states that she feels calmer at this time. Pt noted to not be as tearful on assessment. Pt's son remains at bedside.

## 2015-02-03 NOTE — ED Notes (Signed)
Pt c/o numbness and tingling to her arms and face. MD aware at this time. Pt in NAD. Pt states numbness and tingling worse when the blood pressure cuff is inflated. Pt's son remains at bedside at this time. VSS. MD aware of patient status.

## 2015-02-03 NOTE — ED Notes (Signed)
Pt desat to 88%. MD notified. Pt placed on 2L o2 at this time. Pt O2 saturation 99% at this time. Pt in NAD. Pt's son at bedside at this time.

## 2015-02-03 NOTE — ED Notes (Signed)
Pt repeating, "I'm scared, I feel like I'm going to pass out", pt noted to be tearful at this time. MD at bedside at this time. Pt's son at bedside at this time. Pt instructed that the side rail would be put down at her request, however patient instructed to stay in the bed and not hang her feet over the side at this time due to feeling like she is going to pass out. Pt's son at bedside. Pt states she will not try to get out of bed by herself.

## 2015-02-07 ENCOUNTER — Emergency Department
Admission: EM | Admit: 2015-02-07 | Discharge: 2015-02-07 | Disposition: A | Discharge status: Home / Self Care | Payer: Managed Care, Other (non HMO) | Attending: Student | Admitting: Student

## 2015-02-07 ENCOUNTER — Encounter: Payer: Self-pay | Admitting: *Deleted

## 2015-02-07 DIAGNOSIS — Z79899 Other long term (current) drug therapy: Secondary | ICD-10-CM | POA: Diagnosis not present

## 2015-02-07 DIAGNOSIS — Z7952 Long term (current) use of systemic steroids: Secondary | ICD-10-CM | POA: Diagnosis not present

## 2015-02-07 DIAGNOSIS — F329 Major depressive disorder, single episode, unspecified: Secondary | ICD-10-CM | POA: Diagnosis not present

## 2015-02-07 DIAGNOSIS — F1310 Sedative, hypnotic or anxiolytic abuse, uncomplicated: Secondary | ICD-10-CM | POA: Insufficient documentation

## 2015-02-07 DIAGNOSIS — R45851 Suicidal ideations: Secondary | ICD-10-CM | POA: Diagnosis present

## 2015-02-07 DIAGNOSIS — F419 Anxiety disorder, unspecified: Secondary | ICD-10-CM | POA: Insufficient documentation

## 2015-02-07 DIAGNOSIS — F331 Major depressive disorder, recurrent, moderate: Secondary | ICD-10-CM | POA: Diagnosis not present

## 2015-02-07 DIAGNOSIS — Z87891 Personal history of nicotine dependence: Secondary | ICD-10-CM | POA: Diagnosis not present

## 2015-02-07 DIAGNOSIS — F410 Panic disorder [episodic paroxysmal anxiety] without agoraphobia: Secondary | ICD-10-CM

## 2015-02-07 DIAGNOSIS — F32A Depression, unspecified: Secondary | ICD-10-CM

## 2015-02-07 HISTORY — DX: Depression, unspecified: F32.A

## 2015-02-07 HISTORY — DX: Major depressive disorder, single episode, unspecified: F32.9

## 2015-02-07 LAB — COMPREHENSIVE METABOLIC PANEL
ALT: 25 U/L (ref 14–54)
AST: 26 U/L (ref 15–41)
Albumin: 4.2 g/dL (ref 3.5–5.0)
Alkaline Phosphatase: 62 U/L (ref 38–126)
Anion gap: 12 (ref 5–15)
BUN: 16 mg/dL (ref 6–20)
CHLORIDE: 102 mmol/L (ref 101–111)
CO2: 24 mmol/L (ref 22–32)
CREATININE: 1.12 mg/dL — AB (ref 0.44–1.00)
Calcium: 9.8 mg/dL (ref 8.9–10.3)
GFR, EST NON AFRICAN AMERICAN: 55 mL/min — AB (ref >60)
Glucose, Bld: 89 mg/dL (ref 65–99)
POTASSIUM: 4.2 mmol/L (ref 3.5–5.1)
Sodium: 138 mmol/L (ref 135–145)
TOTAL PROTEIN: 7.9 g/dL (ref 6.5–8.1)
Total Bilirubin: 0.8 mg/dL (ref 0.3–1.2)

## 2015-02-07 LAB — URINALYSIS COMPLETE WITH MICROSCOPIC (ARMC ONLY)
BILIRUBIN URINE: NEGATIVE
GLUCOSE, UA: NEGATIVE mg/dL
HGB URINE DIPSTICK: NEGATIVE
Ketones, ur: NEGATIVE mg/dL
NITRITE: NEGATIVE
Protein, ur: NEGATIVE mg/dL
SPECIFIC GRAVITY, URINE: 1.013 (ref 1.005–1.030)
pH: 6 (ref 5.0–8.0)

## 2015-02-07 LAB — CBC WITH DIFFERENTIAL/PLATELET
BASOS ABS: 0.1 10*3/uL (ref 0–0.1)
BASOS PCT: 1 %
EOS PCT: 1 %
Eosinophils Absolute: 0.1 10*3/uL (ref 0–0.7)
HCT: 46.3 % (ref 35.0–47.0)
Hemoglobin: 15.4 g/dL (ref 12.0–16.0)
LYMPHS PCT: 24 %
Lymphs Abs: 2.5 10*3/uL (ref 1.0–3.6)
MCH: 31.1 pg (ref 26.0–34.0)
MCHC: 33.4 g/dL (ref 32.0–36.0)
MCV: 93.2 fL (ref 80.0–100.0)
Monocytes Absolute: 1 10*3/uL — ABNORMAL HIGH (ref 0.2–0.9)
Monocytes Relative: 9 %
Neutro Abs: 6.6 10*3/uL — ABNORMAL HIGH (ref 1.4–6.5)
Neutrophils Relative %: 65 %
PLATELETS: 275 10*3/uL (ref 150–440)
RBC: 4.96 MIL/uL (ref 3.80–5.20)
RDW: 13.7 % (ref 11.5–14.5)
WBC: 10.2 10*3/uL (ref 3.6–11.0)

## 2015-02-07 LAB — URINE DRUG SCREEN, QUALITATIVE (ARMC ONLY)
Amphetamines, Ur Screen: NOT DETECTED
BARBITURATES, UR SCREEN: NOT DETECTED
BENZODIAZEPINE, UR SCRN: POSITIVE — AB
CANNABINOID 50 NG, UR ~~LOC~~: NOT DETECTED
Cocaine Metabolite,Ur ~~LOC~~: NOT DETECTED
MDMA (Ecstasy)Ur Screen: NOT DETECTED
METHADONE SCREEN, URINE: NOT DETECTED
Opiate, Ur Screen: NOT DETECTED
Phencyclidine (PCP) Ur S: NOT DETECTED
TRICYCLIC, UR SCREEN: NOT DETECTED

## 2015-02-07 LAB — ACETAMINOPHEN LEVEL

## 2015-02-07 LAB — ETHANOL

## 2015-02-07 LAB — SALICYLATE LEVEL

## 2015-02-07 MED ORDER — SERTRALINE HCL 50 MG PO TABS: 50.0000 mg | ORAL_TABLET | Freq: Every day | ORAL | Status: DC

## 2015-02-07 NOTE — ED Notes (Signed)
BEHAVIORAL HEALTH ROUNDING Patient sleeping: No. Patient alert and oriented: yes Behavior appropriate: Yes.  ; If no, describe:  Nutrition and fluids offered: yes Toileting and hygiene offered: Yes  Sitter present: q15 minute observations and security  monitoring Law enforcement present: Yes  ODS  

## 2015-02-07 NOTE — ED Notes (Signed)

## 2015-02-07 NOTE — ED Notes (Signed)
Pt arrives for help, states she feels depressed and suicidal with no plan, states she feels like she is in the way and feels like all she does is take care of people and pay bills, pt very flat affect, sons with her, sent by her PCP, denies any drugs or ETOH

## 2015-02-07 NOTE — Consult Note (Signed)
[facility] Consult   Reason for Consult:  Consult for 55 year old woman who comes to the emergency room complaining of anxiety and depression Referring Physician:  Edd Fabian Patient Identification: Allison Paul MRN:  [id number] Principal Diagnosis: Depression, major, recurrent, moderate ([place]) Diagnosis:   Patient Active Problem List   Diagnosis Date Noted  . Depression, major, recurrent, moderate ([place]) [F33.1] 02/07/2015  . Suicidal ideation [R45.851] 02/07/2015  . Panic attacks [F41.0] 02/07/2015    Total Time spent with patient: 1 hour  Subjective:   Allison Paul is a 55 y.o. female patient admitted with "I'm so depressed and I feel overwhelmed".  HPI:  Patient reports that her mood has been depressed and she's been having anxiety attacks. She had some symptoms characteristic of panic attacks over the last few days. She came to the emergency room last week and was given Xanax and the next day followed up with her primary doctor in [place] who gave her some Klonopin. Since then the panic attacks are not as intense and she is sleeping a little better but her mood is still down. She says she feels like she has to do all of the work for everyone in her house including her husband and her 3 sons. She feels like nobody is giving her any respect or gratitude. She feels tired all the time. Nervous and anxious all the time. She says she has had some thoughts about wishing she were dead but isn't sure she would never act on them because of her religious beliefs and her fear of death. Denies any hallucinations or psychotic symptoms. Evidently last week she did see a primary care doctor who prescribed citalopram for her. She took 10 mg a day for several days but then stopped because it wasn't doing her any good in her opinion. Patient says her biggest stress is having her 3 sons at home and that they are all demanding of her. She also refers to her husband as "good for  nothing". Also finds her job stressful and is worried about losing her job because she took some sick time last week. Her Payton Mccallum  Social history: Patient lives with husband and 3 adult sons. One of them just got out of the army. She appears to resent all of them and think none of them do anything except man things from her. She works at the [employer].  Medical history: Denies any significant medical problems. No history of heart disease diabetes or high blood pressure  Substance abuse history: Patient says she drinks about 2 times a week but only a small amount in the total amount has been decreasing. She does not think the alcohol ever causes a problem. Denies any other substance abuse.  Past Psychiatric History: Patient states that back in the year 2000 she was in a stressful time in her life and saw a mental health provider. Can't remember the name. Was prescribed Zoloft and Klonopin at that time or else is possibly Xanax. Never been in a psychiatric hospital. Never tried to kill her self. No history of mania.  Risk to Self: Is patient at risk for suicide?: Yes Risk to Others:   Prior Inpatient Therapy:   Prior Outpatient Therapy:    Past Medical History:  Past Medical History  Diagnosis Date  . Thyroid disease   . Hypertension   . Depressed     Past Surgical History  Procedure Laterality Date  . Cervical fusion      april 2013  .  Tubal ligation    . Arthoscopic knee      BIL  . Cholecystectomy     Family History: History reviewed. No pertinent family history. Family Psychiatric  History: Patient says she has a sister is been diagnosed with bipolar disorder but doesn't know any other details about it no known suicide in the family. Social History:  History  Alcohol Use  . Yes    Comment: Occassionally     History  Drug Use No    Social History   Social History  . Marital Status: Married    Spouse Name: N/A  . Number of Children: N/A  . Years of Education: N/A    Social History Main Topics  . Smoking status: Former Smoker    Types: Cigarettes    Quit date: 12/06/2014  . Smokeless tobacco: None  . Alcohol Use: Yes     Comment: Occassionally  . Drug Use: No  . Sexual Activity: Not Asked   Other Topics Concern  . None   Social History Narrative   Additional Social History:    Allergies:  No Known Allergies  Labs:  Results for orders placed or performed during the hospital encounter of 02/07/15 (from the past 48 hour(s))  Comprehensive metabolic panel     Status: Abnormal   Collection Time: 02/07/15  2:10 PM  Result Value Ref Range   Sodium 138 135 - 145 mmol/L   Potassium 4.2 3.5 - 5.1 mmol/L    Comment: HEMOLYSIS AT THIS LEVEL MAY AFFECT RESULT   Chloride 102 101 - 111 mmol/L   CO2 24 22 - 32 mmol/L   Glucose, Bld 89 65 - 99 mg/dL   BUN 16 6 - 20 mg/dL   Creatinine, Ser 1.12 (H) 0.44 - 1.00 mg/dL   Calcium 9.8 8.9 - 10.3 mg/dL   Total Protein 7.9 6.5 - 8.1 g/dL   Albumin 4.2 3.5 - 5.0 g/dL   AST 26 15 - 41 U/L   ALT 25 14 - 54 U/L   Alkaline Phosphatase 62 38 - 126 U/L   Total Bilirubin 0.8 0.3 - 1.2 mg/dL   GFR calc non Af Amer 55 (L) >60 mL/min   GFR calc Af Amer >60 >60 mL/min    Comment: (NOTE) The eGFR has been calculated using the CKD EPI equation. This calculation has not been validated in all clinical situations. eGFR's persistently <60 mL/min signify possible Chronic Kidney Disease.    Anion gap 12 5 - 15  Ethanol     Status: None   Collection Time: 02/07/15  2:10 PM  Result Value Ref Range   Alcohol, Ethyl (B) <5 <5 mg/dL    Comment:        LOWEST DETECTABLE LIMIT FOR SERUM ALCOHOL IS 5 mg/dL FOR MEDICAL PURPOSES ONLY   CBC with Diff     Status: Abnormal   Collection Time: 02/07/15  2:10 PM  Result Value Ref Range   WBC 10.2 3.6 - 11.0 K/uL   RBC 4.96 3.80 - 5.20 MIL/uL   Hemoglobin 15.4 12.0 - 16.0 g/dL   HCT 46.3 35.0 - 47.0 %   MCV 93.2 80.0 - 100.0 fL   MCH 31.1 26.0 - 34.0 pg   MCHC 33.4 32.0  - 36.0 g/dL   RDW 13.7 11.5 - 14.5 %   Platelets 275 150 - 440 K/uL   Neutrophils Relative % 65 %   Neutro Abs 6.6 (H) 1.4 - 6.5 K/uL   Lymphocytes Relative 24 %  Lymphs Abs 2.5 1.0 - 3.6 K/uL   Monocytes Relative 9 %   Monocytes Absolute 1.0 (H) 0.2 - 0.9 K/uL   Eosinophils Relative 1 %   Eosinophils Absolute 0.1 0 - 0.7 K/uL   Basophils Relative 1 %   Basophils Absolute 0.1 0 - 0.1 K/uL  Salicylate level     Status: None   Collection Time: 02/07/15  2:10 PM  Result Value Ref Range   Salicylate Lvl 123456 2.8 - 30.0 mg/dL  Acetaminophen level     Status: Abnormal   Collection Time: 02/07/15  2:10 PM  Result Value Ref Range   Acetaminophen (Tylenol), Serum <10 (L) 10 - 30 ug/mL    Comment:        THERAPEUTIC CONCENTRATIONS VARY SIGNIFICANTLY. A RANGE OF 10-30 ug/mL MAY BE AN EFFECTIVE CONCENTRATION FOR MANY PATIENTS. HOWEVER, SOME ARE BEST TREATED AT CONCENTRATIONS OUTSIDE THIS RANGE. ACETAMINOPHEN CONCENTRATIONS >150 ug/mL AT 4 HOURS AFTER INGESTION AND >50 ug/mL AT 12 HOURS AFTER INGESTION ARE OFTEN ASSOCIATED WITH TOXIC REACTIONS.     Current Facility-Administered Medications  Medication Dose Route Frequency Provider Last Rate Last Dose  . sertraline (ZOLOFT) tablet 50 mg  50 mg Oral Daily Gonzella Lex, MD       Current Outpatient Prescriptions  Medication Sig Dispense Refill  . albuterol (PROVENTIL HFA;VENTOLIN HFA) 108 (90 BASE) MCG/ACT inhaler Inhale 2 puffs into the lungs every 6 (six) hours as needed for wheezing or shortness of breath. 1 Inhaler 0  . ALPRAZolam (XANAX) 1 MG tablet Take 1 tablet (1 mg total) by mouth 3 (three) times daily as needed for anxiety. 9 tablet 0  . Glucosamine Sulfate (FP GLUCOSAMINE PO) Take 2 tablets by mouth daily.    Marland Kitchen ibuprofen (ADVIL,MOTRIN) 800 MG tablet Take 800 mg by mouth every 8 (eight) hours as needed. pain    . levothyroxine (SYNTHROID, LEVOTHROID) 112 MCG tablet Take 112 mcg by mouth daily.    . metoprolol (LOPRESSOR)  50 MG tablet Take 50 mg by mouth every morning.    . predniSONE (DELTASONE) 20 MG tablet Take 2 tablets (40 mg total) by mouth daily. 8 tablet 0  . sertraline (ZOLOFT) 50 MG tablet Take 1 tablet (50 mg total) by mouth daily. 30 tablet 0    Musculoskeletal: Strength & Muscle Tone: within normal limits Gait & Station: normal Patient leans: N/A  Psychiatric Specialty Exam: Review of Systems  Constitutional: Negative.   HENT: Negative.   Eyes: Negative.   Respiratory: Negative.   Cardiovascular: Negative.   Gastrointestinal: Negative.   Musculoskeletal: Negative.   Skin: Negative.   Neurological: Negative.   Psychiatric/Behavioral: Positive for depression and suicidal ideas. Negative for hallucinations, memory loss and substance abuse. The patient is nervous/anxious. The patient does not have insomnia.     Blood pressure 122/82, pulse 81, temperature 98.5 F (36.9 C), temperature source Oral, resp. rate 18, height 5\' 3"  (1.6 m), weight 119.75 kg (264 lb), SpO2 98 %.Body mass index is 46.78 kg/(m^2).  General Appearance: Fairly Groomed  Engineer, water::  Fair  Speech:  Slow  Volume:  Decreased  Mood:  Dysphoric  Affect:  Depressed  Thought Process:  Logical  Orientation:  Full (Time, Place, and Person)  Thought Content:  Negative  Suicidal Thoughts:  Yes.  without intent/plan  Homicidal Thoughts:  No  Memory:  Immediate;   Fair Recent;   Fair Remote;   Fair  Judgement:  Fair  Insight:  Fair  Psychomotor Activity:  Decreased  Concentration:  Fair  Recall:  [place]: Fair  Akathisia:  No  Handed:  Right  AIMS (if indicated):     Assets:  Communication Skills Desire for Improvement Financial Resources/Insurance Housing Physical Health Resilience  ADL's:  Intact  Cognition: WNL  Sleep:      Treatment Plan Summary: Plan Patient is very clear in stating that she has no intention or plan to kill her self and gives good reasons why she would  not. She is not psychotic. Has no past history of suicide attempts. Patient has symptoms consistent with major depression and panic attacks. The suicidal ideation problem seems to be under enough control that it doesn't require hospitalization but will require monitoring. Patient was offered recommendations on treatment of depression. Because she had taken Zoloft in the past I suggested we try that again. Side effects discussed. The utility and time course of antidepressants was discussed. Start Zoloft 50 mg per day. She will be given referrals to her local provider or other mental health providers. Case reviewed per due to emergency room doctor. No commitment filed. Patient can be released from the emergency room.  Disposition: Patient does not meet criteria for psychiatric inpatient admission. Supportive therapy provided about ongoing stressors.  Alethia Berthold, MD 02/07/2015 5:39 PM

## 2015-02-07 NOTE — ED Provider Notes (Signed)
[facility] Emergency Department Provider Note  ____________________________________________  Time seen: Approximately 3:09 PM  I have reviewed the triage vital signs and the nursing notes.   HISTORY  Chief Complaint Depression and Suicidal    HPI Allison Paul is a 55 y.o. female with history of thyroid disease, hypertension, depression, anxiety who presents for evaluation of worsening depression and passive suicidal ideation, gradual onset, ongoing for several days, constant since onset, currently severe. No modifying factors. She reports that she has been having thoughts of wanting to die but that she is "too chicken to kill myself". No homicidal ideation or audiovisual hallucinations. She was recently treated for a sinus infection however reports that her symptoms have been improving. No chest pain or difficulty breathing. No vomiting, diarrhea, fevers or chills.     Past Medical History  Diagnosis Date  . Thyroid disease   . Hypertension   . Depressed     There are no active problems to display for this patient.   Past Surgical History  Procedure Laterality Date  . Cervical fusion      april 2013  . Tubal ligation    . Arthoscopic knee      BIL  . Cholecystectomy      Current Outpatient Rx  Name  Route  Sig  Dispense  Refill  . albuterol (PROVENTIL HFA;VENTOLIN HFA) 108 (90 BASE) MCG/ACT inhaler   Inhalation   Inhale 2 puffs into the lungs every 6 (six) hours as needed for wheezing or shortness of breath.   1 Inhaler   0   . ALPRAZolam (XANAX) 1 MG tablet   Oral   Take 1 tablet (1 mg total) by mouth 3 (three) times daily as needed for anxiety.   9 tablet   0   . Glucosamine Sulfate (FP GLUCOSAMINE PO)   Oral   Take 2 tablets by mouth daily.         Marland Kitchen ibuprofen (ADVIL,MOTRIN) 800 MG tablet   Oral   Take 800 mg by mouth every 8 (eight) hours as needed. pain         . levothyroxine (SYNTHROID, LEVOTHROID) 112 MCG  tablet   Oral   Take 112 mcg by mouth daily.         . metoprolol (LOPRESSOR) 50 MG tablet   Oral   Take 50 mg by mouth every morning.         . predniSONE (DELTASONE) 20 MG tablet   Oral   Take 2 tablets (40 mg total) by mouth daily.   8 tablet   0   . sertraline (ZOLOFT) 50 MG tablet   Oral   Take 1 tablet (50 mg total) by mouth daily.   30 tablet   0     Allergies Review of patient's allergies indicates no known allergies.  History reviewed. No pertinent family history.  Social History Social History  Substance Use Topics  . Smoking status: Former Smoker    Types: Cigarettes    Quit date: 12/06/2014  . Smokeless tobacco: None  . Alcohol Use: Yes     Comment: Occassionally    Review of Systems Constitutional: No fever/chills Eyes: No visual changes. ENT: No sore throat. Cardiovascular: Denies chest pain. Respiratory: Denies shortness of breath. Gastrointestinal: No abdominal pain.  No nausea, no vomiting.  No diarrhea.  No constipation. Genitourinary: Negative for dysuria. Musculoskeletal: Negative for back pain. Skin: Negative for rash. Neurological: Negative for headaches, focal weakness or numbness.  10-point  ROS otherwise negative.  ____________________________________________   PHYSICAL EXAM:  VITAL SIGNS: ED Triage Vitals  Enc Vitals Group     BP 02/07/15 1414 122/82 mmHg     Pulse Rate 02/07/15 1414 81     Resp 02/07/15 1414 18     Temp 02/07/15 1414 98.5 F (36.9 C)     Temp Source 02/07/15 1414 Oral     SpO2 02/07/15 1414 98 %     Weight 02/07/15 1414 264 lb (119.75 kg)     Height 02/07/15 1414 5\' 3"  (1.6 m)     Head Cir --      Peak Flow --      Pain Score 02/07/15 1414 0     Pain Loc --      Pain Edu? --      Excl. in [place]? --     Constitutional: Alert and oriented. Well appearing and in no acute distress. Eyes: Conjunctivae are normal. PERRL. EOMI. Head: Atraumatic. Nose: No congestion/rhinnorhea. Mouth/Throat: Mucous  membranes are moist.  Oropharynx non-erythematous. Neck: No stridor.   Cardiovascular: Normal rate, regular rhythm. Grossly normal heart sounds.  Good peripheral circulation. Respiratory: Normal respiratory effort.  No retractions. Lungs CTAB. Gastrointestinal: Soft and nontender. No distention.No CVA tenderness. Genitourinary: deferred Musculoskeletal: No lower extremity tenderness nor edema.  No joint effusions. Neurologic:  Normal speech and language. No gross focal neurologic deficits are appreciated. No gait instability. Skin:  Skin is warm, dry and intact. No rash noted. Psychiatric: Mood is depressed and affect is blunted.  ____________________________________________   LABS (all labs ordered are listed, but only abnormal results are displayed)  Labs Reviewed  COMPREHENSIVE METABOLIC PANEL - Abnormal; Notable for the following:    Creatinine, Ser 1.12 (*)    GFR calc non Af Amer 55 (*)    All other components within normal limits  CBC WITH DIFFERENTIAL/PLATELET - Abnormal; Notable for the following:    Neutro Abs 6.6 (*)    Monocytes Absolute 1.0 (*)    All other components within normal limits  ACETAMINOPHEN LEVEL - Abnormal; Notable for the following:    Acetaminophen (Tylenol), Serum <10 (*)    All other components within normal limits  ETHANOL  SALICYLATE LEVEL  URINE DRUG SCREEN, QUALITATIVE ([facility] ONLY)  URINALYSIS COMPLETEWITH MICROSCOPIC ([facility] ONLY)   ____________________________________________  EKG  none ____________________________________________  RADIOLOGY  none ____________________________________________   PROCEDURES  Procedure(s) performed: None  Critical Care performed: No  ____________________________________________   INITIAL IMPRESSION / ASSESSMENT AND PLAN / ED COURSE  Pertinent labs & imaging results that were available during my care of the patient were reviewed by me and considered in my medical decision making (see chart for  details).  Allison Paul is a 55 y.o. female with history of thyroid disease, hypertension, depression, anxiety who presents for evaluation of worsening depression and passive suicidal ideation. On exam, she is not ill-appearing, she is in no acute distress. Vital signs are stable, she is afebrile. She has a benign physical examination. She has no acute medical complaints. She is here seeking care voluntarily, will consult psychiatry and [place]. Screening psychiatry labs reviewed. CBC unremarkable. Undetectable ethanol levels, salicylate levels and acetaminophen levels. CMP is notable for mild creatinine elevation 1.12, will encourage oral hydration. The patient is medically cleared.  ----------------------------------------- 5:17 PM on 02/07/2015 ----------------------------------------- Case discussed with Dr. Carlena Hurl who has evaluated the patient and recommends discharge with [facility] follow-up or with the patient's outpatient provider at [facility] clinic. He has  written her prescription for Zoloft. We'll DC home with return precautions.  ____________________________________________   FINAL CLINICAL IMPRESSION(S) / ED DIAGNOSES  Final diagnoses:  Depression  Anxiety      Joanne Gavel, MD 02/07/15 [phone]

## 2015-02-07 NOTE — Progress Notes (Signed)
Per request of ER MD (Clapacs), writer provided the pt. with information and instructions on how to access [facility] ([place], [facility] and [employer])   02/07/2015 Con Memos, MS, [place], [facility] Therapeutic Triage Specialist

## 2015-02-14 DIAGNOSIS — F419 Anxiety disorder, unspecified: Secondary | ICD-10-CM | POA: Insufficient documentation

## 2016-05-26 DIAGNOSIS — M79642 Pain in left hand: Secondary | ICD-10-CM | POA: Insufficient documentation

## 2016-06-08 DIAGNOSIS — G5620 Lesion of ulnar nerve, unspecified upper limb: Secondary | ICD-10-CM | POA: Insufficient documentation

## 2016-10-13 DIAGNOSIS — Z6841 Body Mass Index (BMI) 40.0 and over, adult: Secondary | ICD-10-CM | POA: Insufficient documentation

## 2016-10-19 DIAGNOSIS — G5602 Carpal tunnel syndrome, left upper limb: Secondary | ICD-10-CM | POA: Insufficient documentation

## 2016-12-31 DIAGNOSIS — G5622 Lesion of ulnar nerve, left upper limb: Secondary | ICD-10-CM | POA: Insufficient documentation

## 2016-12-31 DIAGNOSIS — M25531 Pain in right wrist: Secondary | ICD-10-CM | POA: Insufficient documentation

## 2017-04-18 DIAGNOSIS — R531 Weakness: Secondary | ICD-10-CM | POA: Insufficient documentation

## 2017-04-18 DIAGNOSIS — M25631 Stiffness of right wrist, not elsewhere classified: Secondary | ICD-10-CM | POA: Insufficient documentation

## 2019-09-04 DIAGNOSIS — R0602 Shortness of breath: Secondary | ICD-10-CM | POA: Insufficient documentation

## 2019-09-04 DIAGNOSIS — E782 Mixed hyperlipidemia: Secondary | ICD-10-CM | POA: Insufficient documentation

## 2020-02-18 ENCOUNTER — Emergency Department
Admission: EM | Admit: 2020-02-18 | Discharge: 2020-02-18 | Disposition: A | Discharge status: Home / Self Care | Payer: Medicare HMO | Attending: Emergency Medicine | Admitting: Emergency Medicine

## 2020-02-18 ENCOUNTER — Other Ambulatory Visit: Payer: Self-pay

## 2020-02-18 DIAGNOSIS — Z79899 Other long term (current) drug therapy: Secondary | ICD-10-CM | POA: Diagnosis not present

## 2020-02-18 DIAGNOSIS — I10 Essential (primary) hypertension: Secondary | ICD-10-CM | POA: Insufficient documentation

## 2020-02-18 DIAGNOSIS — R0989 Other specified symptoms and signs involving the circulatory and respiratory systems: Secondary | ICD-10-CM | POA: Diagnosis present

## 2020-02-18 DIAGNOSIS — Z87891 Personal history of nicotine dependence: Secondary | ICD-10-CM | POA: Insufficient documentation

## 2020-02-18 DIAGNOSIS — R198 Other specified symptoms and signs involving the digestive system and abdomen: Secondary | ICD-10-CM

## 2020-02-18 DIAGNOSIS — E039 Hypothyroidism, unspecified: Secondary | ICD-10-CM | POA: Diagnosis not present

## 2020-02-18 MED ORDER — ALUM & MAG HYDROXIDE-SIMETH 200-200-20 MG/5ML PO SUSP
15.0000 mL | Freq: Once | ORAL | Status: AC
  Administered 2020-02-18: 15 mL via ORAL
  Filled 2020-02-18: qty 30

## 2020-02-18 MED ORDER — LIDOCAINE VISCOUS HCL 2 % MT SOLN
15.0000 mL | Freq: Once | OROMUCOSAL | Status: AC
  Administered 2020-02-18: 15 mL via ORAL
  Filled 2020-02-18: qty 15

## 2020-02-18 NOTE — ED Provider Notes (Signed)
[facility] Emergency Department Provider Note   ____________________________________________   Event Date/Time   First MD Initiated Contact with Patient 02/18/20 2020     (approximate)  I have reviewed the triage vital signs and the nursing notes.   HISTORY  Chief Complaint Foreign Body (In throat)    HPI Allison Paul is a 60 y.o. female with past medical history of hypertension, anxiety, and hypothyroidism who presents to the ED for foreign body in throat.  Patient reports that for the past couple of weeks she has intermittently felt like food is getting stuck in the upper part of her throat.  She states sometimes it will feel like it is stuck behind her epiglottis or either one of her tonsils but she never feels like it gets stuck in her lower throat or chest.  She was eating some salmon with rice earlier tonight when she felt like a kernel of rice got stuck in the back of her throat.  She denies any difficulty breathing and she has not had any difficulty swallowing her secretions.  Since this happened, she has been able to drink water without difficulty and denies any nausea or vomiting.        Past Medical History:  Diagnosis Date  . Depressed   . Hypertension   . Thyroid disease     Patient Active Problem List   Diagnosis Date Noted  . Depression, major, recurrent, moderate ([place]) 02/07/2015  . Suicidal ideation 02/07/2015  . Panic attacks 02/07/2015    Past Surgical History:  Procedure Laterality Date  . arthoscopic knee     BIL  . CERVICAL FUSION     april 2013  . CHOLECYSTECTOMY    . TUBAL LIGATION      Prior to Admission medications   Medication Sig Start Date End Date Taking? Authorizing Provider  albuterol (PROVENTIL HFA;VENTOLIN HFA) 108 (90 BASE) MCG/ACT inhaler Inhale 2 puffs into the lungs every 6 (six) hours as needed for wheezing or shortness of breath. 12/18/14   Nance Pear, MD  Glucosamine Sulfate (FP  GLUCOSAMINE PO) Take 2 tablets by mouth daily.    [provider]  ibuprofen (ADVIL,MOTRIN) 800 MG tablet Take 800 mg by mouth every 8 (eight) hours as needed. pain    [provider]  levothyroxine (SYNTHROID, LEVOTHROID) 112 MCG tablet Take 112 mcg by mouth daily.    [provider]  metoprolol (LOPRESSOR) 50 MG tablet Take 50 mg by mouth every morning.    [provider]  predniSONE (DELTASONE) 20 MG tablet Take 2 tablets (40 mg total) by mouth daily. 12/18/14   Nance Pear, MD  sertraline (ZOLOFT) 50 MG tablet Take 1 tablet (50 mg total) by mouth daily. 02/07/15   Clapacs, Madie Reno, MD    Allergies Prednisone  No family history on file.  Social History Social History   Tobacco Use  . Smoking status: Former Smoker    Types: Cigarettes    Quit date: 12/06/2014    Years since quitting: 5.2  Substance Use Topics  . Alcohol use: Yes    Comment: Occassionally  . Drug use: No    Review of Systems  Constitutional: No fever/chills Eyes: No visual changes. ENT: No sore throat.  Positive for difficulty swallowing. Cardiovascular: Denies chest pain. Respiratory: Denies shortness of breath. Gastrointestinal: No abdominal pain.  No nausea, no vomiting.  No diarrhea.  No constipation. Genitourinary: Negative for dysuria. Musculoskeletal: Negative for back pain. Skin: Negative for rash.  Neurological: Negative for headaches, focal weakness or numbness.  ____________________________________________   PHYSICAL EXAM:  VITAL SIGNS: ED Triage Vitals  Enc Vitals Group     BP 02/18/20 1827 (!) 157/81     Pulse Rate 02/18/20 1827 80     Resp 02/18/20 1827 20     Temp 02/18/20 1827 97.9 F (36.6 C)     Temp Source 02/18/20 1827 Oral     SpO2 02/18/20 1827 97 %     Weight 02/18/20 1828 275 lb (124.7 kg)     Height 02/18/20 1828 5\' 3"  (1.6 m)     Head Circumference --      Peak Flow --      Pain Score 02/18/20 1828 0     Pain Loc --      Pain  Edu? --      Excl. in [place]? --     Constitutional: Alert and oriented. Eyes: Conjunctivae are normal. Head: Atraumatic. Nose: No congestion/rhinnorhea. Mouth/Throat: Mucous membranes are moist.  Oropharynx clear with no foreign body or tonsillar edema. Neck: Normal ROM Cardiovascular: Normal rate, regular rhythm. Grossly normal heart sounds. Respiratory: Normal respiratory effort.  No retractions. Lungs CTAB. Gastrointestinal: Soft and nontender. No distention. Genitourinary: deferred Musculoskeletal: No lower extremity tenderness nor edema. Neurologic:  Normal speech and language. No gross focal neurologic deficits are appreciated. Skin:  Skin is warm, dry and intact. No rash noted. Psychiatric: Mood and affect are normal. Speech and behavior are normal.  ____________________________________________   LABS (all labs ordered are listed, but only abnormal results are displayed)  Labs Reviewed - No data to display   PROCEDURES  Procedure(s) performed (including Critical Care):  Procedures   ____________________________________________   INITIAL IMPRESSION / ASSESSMENT AND PLAN / ED COURSE       60 year old female with past medical history of hypertension, anxiety, and hypothyroidism who presents to the ED for sensation of a kernel of rice stuck in the upper part of her throat.  With thorough inspection of her oropharynx I do not see any evidence of a foreign body.  Patient is tolerating her secretions without difficulty and has no signs of airway obstruction, denies difficulty breathing.  She was given GI cocktail with no change in her symptoms, but was able to tolerate the medication without difficulty.  Given no evidence of airway or esophageal obstruction, she is appropriate for discharge home with ENT and GI follow-up.  She was counseled to return to the ED for new worsening symptoms, patient agrees with plan.      ____________________________________________   FINAL  CLINICAL IMPRESSION(S) / ED DIAGNOSES  Final diagnoses:  Globus sensation     ED Discharge Orders    None       Note:  This document was prepared using Dragon voice recognition software and may include unintentional dictation errors.   Blake Divine, MD 02/18/20 2118

## 2020-02-18 NOTE — ED Notes (Addendum)
Patient anxious. no visible debris in throat. Tolerated oral medication.

## 2020-02-18 NOTE — ED Triage Notes (Signed)
Pt states she has had a piece of rice stuck in her throat for 2 hours- pt states she is able to get other foods and fluids down but cannot get the piece of rice out- airway clear

## 2020-02-20 ENCOUNTER — Other Ambulatory Visit: Payer: Self-pay | Admitting: Otolaryngology

## 2020-02-20 ENCOUNTER — Other Ambulatory Visit (HOSPITAL_COMMUNITY): Payer: Self-pay | Admitting: Otolaryngology

## 2020-02-20 DIAGNOSIS — R1310 Dysphagia, unspecified: Secondary | ICD-10-CM

## 2020-02-27 ENCOUNTER — Ambulatory Visit
Admission: RE | Admit: 2020-02-27 | Discharge: 2020-02-27 | Disposition: A | Discharge status: Home / Self Care | Payer: Medicare HMO | Source: Ambulatory Visit | Attending: Otolaryngology | Admitting: Otolaryngology

## 2020-02-27 DIAGNOSIS — R1310 Dysphagia, unspecified: Secondary | ICD-10-CM | POA: Diagnosis not present

## 2020-03-11 ENCOUNTER — Encounter: Payer: Self-pay | Admitting: *Deleted

## 2020-03-15 ENCOUNTER — Other Ambulatory Visit: Payer: Self-pay | Admitting: Family Medicine

## 2020-03-15 DIAGNOSIS — R0989 Other specified symptoms and signs involving the circulatory and respiratory systems: Secondary | ICD-10-CM

## 2020-03-15 DIAGNOSIS — R198 Other specified symptoms and signs involving the digestive system and abdomen: Secondary | ICD-10-CM

## 2020-03-29 ENCOUNTER — Ambulatory Visit
Admission: RE | Admit: 2020-03-29 | Discharge: 2020-03-29 | Disposition: A | Discharge status: Home / Self Care | Payer: Medicare HMO | Source: Ambulatory Visit | Attending: Family Medicine | Admitting: Family Medicine

## 2020-03-29 ENCOUNTER — Other Ambulatory Visit: Payer: Self-pay

## 2020-03-29 DIAGNOSIS — R198 Other specified symptoms and signs involving the digestive system and abdomen: Secondary | ICD-10-CM | POA: Diagnosis present

## 2020-03-29 DIAGNOSIS — R0989 Other specified symptoms and signs involving the circulatory and respiratory systems: Secondary | ICD-10-CM

## 2020-04-10 ENCOUNTER — Other Ambulatory Visit: Payer: Self-pay | Admitting: Family Medicine

## 2020-04-10 DIAGNOSIS — R200 Anesthesia of skin: Secondary | ICD-10-CM

## 2020-04-10 DIAGNOSIS — R42 Dizziness and giddiness: Secondary | ICD-10-CM

## 2020-04-24 ENCOUNTER — Ambulatory Visit (INDEPENDENT_AMBULATORY_CARE_PROVIDER_SITE_OTHER): Payer: Medicare HMO | Admitting: Gastroenterology

## 2020-04-24 ENCOUNTER — Encounter: Payer: Self-pay | Admitting: Gastroenterology

## 2020-04-24 ENCOUNTER — Other Ambulatory Visit: Payer: Self-pay

## 2020-04-24 VITALS — BP 139/84 | HR 66 | Temp 98.1°F | Wt 273.0 lb

## 2020-04-24 DIAGNOSIS — Z1211 Encounter for screening for malignant neoplasm of colon: Secondary | ICD-10-CM

## 2020-04-24 DIAGNOSIS — F32A Depression, unspecified: Secondary | ICD-10-CM | POA: Insufficient documentation

## 2020-04-24 DIAGNOSIS — Z720 Tobacco use: Secondary | ICD-10-CM | POA: Insufficient documentation

## 2020-04-24 DIAGNOSIS — K219 Gastro-esophageal reflux disease without esophagitis: Secondary | ICD-10-CM

## 2020-04-24 DIAGNOSIS — R1319 Other dysphagia: Secondary | ICD-10-CM

## 2020-04-24 DIAGNOSIS — M1990 Unspecified osteoarthritis, unspecified site: Secondary | ICD-10-CM | POA: Insufficient documentation

## 2020-04-24 MED ORDER — NA SULFATE-K SULFATE-MG SULF 17.5-3.13-1.6 GM/177ML PO SOLN: ORAL | 0 refills | Status: DC

## 2020-04-24 NOTE — Progress Notes (Signed)
Allison Paul [address]  [place]  [place], [place] [address]  Main: [phone]  Fax: [phone]   Gastroenterology Consultation  Referring Provider:     Valera Castle, * Primary Care Physician:  Valera Castle, MD Reason for Consultation:     GERD        HPI:    Chief Complaint  Patient presents with  . Gastroesophageal Reflux    Allison Paul is a 60 y.o. y/o female referred for consultation & management  by Dr. Kym Groom, Guy Begin, MD.  Patient was recently seen by ENT for dysphagia and reflux and underwent laryngoscopy and esophagram that showed evidence of reflux.  No etiology to explain her dysphagia was seen on this exam.  Patient reports dysphagia to solid foods intermittently over the last year.  No episodes of food impaction.  Was prescribed omeprazole by ENT and takes it intermittently, states specially when symptoms are worse at night as far as heartburn is concerned.  Does not take it daily.  No prior upper endoscopy.  No reflux.  No nausea or vomiting.  No altered bowel habits or blood in stool.  Reports colonoscopy done 10 years ago was normal.  Procedure report not available.  Past Medical History:  Diagnosis Date  . Depressed   . Hypertension   . Thyroid disease     Past Surgical History:  Procedure Laterality Date  . arthoscopic knee     BIL  . CERVICAL FUSION     april 2013  . CHOLECYSTECTOMY    . TUBAL LIGATION      Prior to Admission medications   Medication Sig Start Date End Date Taking? Authorizing Provider  albuterol (PROVENTIL HFA;VENTOLIN HFA) 108 (90 BASE) MCG/ACT inhaler Inhale 2 puffs into the lungs every 6 (six) hours as needed for wheezing or shortness of breath. 12/18/14  Yes Nance Pear, MD  amLODipine (NORVASC) 2.5 MG tablet Take 2.5 mg by mouth daily. 11/06/19  Yes [provider]  azelastine (ASTELIN) 0.1 % nasal spray Place 1 spray into both nostrils 1 day or 1 dose. 02/20/20   Yes [provider]  cefdinir (OMNICEF) 300 MG capsule Take 300 mg by mouth every 12 (twelve) hours. 02/04/20  Yes [provider]  Cholecalciferol (VITAMIN D3) 1.25 MG (50000 UT) CAPS Take 2 capsules by mouth daily.   Yes [provider]  FISH OIL-BORAGE-FLAX-SAFFLOWER PO Take 2 capsules by mouth daily.   Yes [provider]  Glucosamine Sulfate 500 MG TABS Take 1 tablet by mouth daily.   Yes [provider]  ibuprofen (ADVIL,MOTRIN) 800 MG tablet Take 800 mg by mouth every 8 (eight) hours as needed. pain   Yes [provider]  levothyroxine (SYNTHROID) 137 MCG tablet Take 1 tablet by mouth daily. 08/15/19 08/14/20 Yes [provider]  metoprolol (LOPRESSOR) 50 MG tablet Take 50 mg by mouth every morning.   Yes [provider]  Multiple Vitamin (MULTI-VITAMIN) tablet Take 1 tablet by mouth daily.   Yes [provider]  Na Sulfate-K Sulfate-Mg Sulf 17.5-3.13-1.6 GM/177ML SOLN At 5 PM the day before procedure take 1 bottle and 5 hours before procedure take 1 bottle. 04/24/20  Yes Exavior Kimmons, Margretta Sidle B, MD  naproxen (NAPROSYN) 500 MG tablet Take 1 tablet by mouth daily. 08/20/16  Yes [provider]  omeprazole (PRILOSEC) 20 MG capsule 1 capsule daily. 03/12/20  Yes [provider]  predniSONE (DELTASONE) 20 MG tablet Take 2 tablets (40 mg  total) by mouth daily. 12/18/14  Yes Nance Pear, MD  saccharomyces boulardii (FLORASTOR) 250 MG capsule Take 1 capsule by mouth daily.   Yes [provider]  sertraline (ZOLOFT) 100 MG tablet Take 1 tablet by mouth daily. 02/22/15  Yes [provider]  triamterene-hydrochlorothiazide (DYAZIDE) 37.5-25 MG capsule Take 1 capsule by mouth daily. 10/04/19  Yes [provider]    No family history on file.   Social History   Tobacco Use  . Smoking status: Former Smoker    Types: Cigarettes    Quit date: 12/06/2014    Years since quitting: 5.3   . Smokeless tobacco: Never Used  Substance Use Topics  . Alcohol use: Yes    Comment: Occassionally  . Drug use: No    Allergies as of 04/24/2020 - Review Complete 04/24/2020  Allergen Reaction Noted  . Prednisone  02/18/2020    Review of Systems:    All systems reviewed and negative except where noted in HPI.   Physical Exam:  BP 139/84   Pulse 66   Temp 98.1 F (36.7 C) (Oral)   Wt 273 lb (123.8 kg)   BMI 48.36 kg/m  No LMP recorded. Patient is postmenopausal. Psych:  Alert and cooperative. Normal mood and affect. General:   Alert,  Well-developed, well-nourished, pleasant and cooperative in NAD Head:  Normocephalic and atraumatic. Eyes:  Sclera clear, no icterus.   Conjunctiva pink. Ears:  Normal auditory acuity. Nose:  No deformity, discharge, or lesions. Mouth:  No deformity or lesions,oropharynx pink & moist. Neck:  Supple; no masses or thyromegaly. Abdomen:  Normal bowel sounds.  No bruits.  Soft, non-tender and non-distended without masses, hepatosplenomegaly or hernias noted.  No guarding or rebound tenderness.    Msk:  Symmetrical without gross deformities. Good, equal movement & strength bilaterally. Pulses:  Normal pulses noted. Extremities:  No clubbing or edema.  No cyanosis. Neurologic:  Alert and oriented x3;  grossly normal neurologically. Skin:  Intact without significant lesions or rashes. No jaundice. Lymph Nodes:  No significant cervical adenopathy. Psych:  Alert and cooperative. Normal mood and affect.   Labs: CBC    Component Value Date/Time   WBC 10.2 02/07/2015 1410   RBC 4.96 02/07/2015 1410   HGB 15.4 02/07/2015 1410   HGB 12.9 04/09/2014 1439   HCT 46.3 02/07/2015 1410   HCT 39.2 04/09/2014 1439   PLT 275 02/07/2015 1410   PLT 231 04/09/2014 1439   MCV 93.2 02/07/2015 1410   MCV 96 04/09/2014 1439   MCH 31.1 02/07/2015 1410   MCHC 33.4 02/07/2015 1410   RDW 13.7 02/07/2015 1410   RDW 13.9 04/09/2014 1439   LYMPHSABS 2.5  02/07/2015 1410   MONOABS 1.0 (H) 02/07/2015 1410   EOSABS 0.1 02/07/2015 1410   BASOSABS 0.1 02/07/2015 1410   CMP     Component Value Date/Time   NA 138 02/07/2015 1410   NA 136 04/09/2014 1439   K 4.2 02/07/2015 1410   K 3.7 04/09/2014 1439   CL 102 02/07/2015 1410   CL 102 04/09/2014 1439   CO2 24 02/07/2015 1410   CO2 24 04/09/2014 1439   GLUCOSE 89 02/07/2015 1410   GLUCOSE 97 04/09/2014 1439   BUN 16 02/07/2015 1410   BUN 26 (H) 04/09/2014 1439   CREATININE 1.12 (H) 02/07/2015 1410   CREATININE 0.97 04/09/2014 1439   CALCIUM 9.8 02/07/2015 1410   CALCIUM 9.6 04/09/2014 1439   PROT 7.9 02/07/2015 1410   PROT 7.5  08/09/2012 0218   ALBUMIN 4.2 02/07/2015 1410   ALBUMIN 3.4 08/09/2012 0218   AST 26 02/07/2015 1410   AST 33 08/09/2012 0218   ALT 25 02/07/2015 1410   ALT 35 08/09/2012 0218   ALKPHOS 62 02/07/2015 1410   ALKPHOS 73 08/09/2012 0218   BILITOT 0.8 02/07/2015 1410   BILITOT 0.2 08/09/2012 0218   GFRNONAA 55 (L) 02/07/2015 1410   GFRNONAA >60 04/09/2014 1439   GFRAA >60 02/07/2015 1410   GFRAA >60 04/09/2014 1439    Imaging Studies: US THYROID  Result Date: 03/29/2020 CLINICAL DATA:  60 year old female with a history of globus sensation EXAM: THYROID ULTRASOUND TECHNIQUE: Ultrasound examination of the thyroid gland and adjacent soft tissues was performed. COMPARISON:  None. FINDINGS: Parenchymal Echotexture: Normal Isthmus: 0.3 cm Right lobe: 3.9 cm x 1.4 cm x 1.0 cm Left lobe: 3.2 cm x 1.4 cm x 1.0 cm _________________________________________________________ Estimated total number of nodules >/= 1 cm: 0 Number of spongiform nodules >/=  2 cm not described below (TR1): 0 Number of mixed cystic and solid nodules >/= 1.5 cm not described below (TR2): 0 _________________________________________________________ Nodule # 1: Location: Left; Inferior Maximum size: 0.9 cm; Other 2 dimensions: 0.6 cm x 0.5 cm Composition: cannot determine (2) Echogenicity: hypoechoic  (2) Shape: not taller-than-wide (0) Margins: ill-defined (0) Echogenic foci: none (0) ACR TI-RADS total points: 4. ACR TI-RADS risk category: TR4 (4-6 points). ACR TI-RADS recommendations: Nodule does not meet criteria for surveillance or biopsy _________________________________________________________ Lymph nodes present within the cervical nodal stations. None of these are enlarged, with typical architecture maintained within the imaged nodes. Recommendations follow those established by the new ACR TI-RADS criteria (J Am Coll Radiol 9024;09:735-329). IMPRESSION: Small lymph nodes with typical architecture in the cervical nodal stations, nonspecific though potentially reactive. Electronically Signed   By: Corrie Mckusick D.O.   On: 03/29/2020 16:53    Assessment and Plan:   Allison Paul is a 60 y.o. y/o female has been referred for GERD and dysphagia  Patient solid food dysphagia for the last year, with no prior upper endoscopy  EGD indicated for further evaluation of underlying Rings or strictures  Patient educated extensively on acid reflux lifestyle modification, including buying a bed wedge, not eating 3 hrs before bedtime, diet modifications, and handout given for the same.   Alternative options of conservative management were discussed in detail, including but not limited to medication management, foregoing endoscopic procedures at this time and others.    I have discussed alternative options, risks & benefits,  which include, but are not limited to, bleeding, infection, perforation,respiratory complication & drug reaction.  The patient agrees with this plan & written consent will be obtained.    Patient also due for screening colonoscopy    Dr Allison Paul  Speech recognition software was used to dictate the above note.

## 2020-04-29 ENCOUNTER — Telehealth: Payer: Self-pay | Admitting: Gastroenterology

## 2020-04-29 ENCOUNTER — Other Ambulatory Visit: Payer: Self-pay

## 2020-04-29 MED ORDER — PEG 3350-KCL-NA BICARB-NACL 420 G PO SOLR: ORAL | 0 refills | Status: DC

## 2020-04-29 NOTE — Telephone Encounter (Signed)
Called patient to let her know that I sent her Golutely and hopefully it would be cheaper for her. Patient understood and had no further questions.

## 2020-04-29 NOTE — Telephone Encounter (Signed)
Patient is upset with cost of prep.  Call patient to discuss a less expensive alternative for prep.  Patient stated that she would cancel procedure all together if there is not a less expensive alternative.

## 2020-04-30 ENCOUNTER — Telehealth: Payer: Self-pay

## 2020-04-30 NOTE — Telephone Encounter (Signed)
Patient lvm that she would like to cancel her procedures with Dr. Darene Lamer, then she left another voice message that she would like to speak to the nurse to discuss.  Her procedure is scheduled for 05/26/20.  Thanks,  [place], [place]

## 2020-04-30 NOTE — Telephone Encounter (Signed)
Patient called back and said she would keep the appointment for procedure.

## 2020-04-30 NOTE — Telephone Encounter (Signed)
Called patient back and informed her what medications she could and could not take. In addition, I told her that when the nurses from the endoscopy unit gives her a call, they will also let her know which medications to take. Patient understood and had no further questions.

## 2020-04-30 NOTE — Telephone Encounter (Signed)
Patient would like call back from nurse to discuss what herbal meds she needs to discontinue before procedure.

## 2020-05-03 ENCOUNTER — Encounter: Payer: Self-pay | Admitting: Gastroenterology

## 2020-05-06 ENCOUNTER — Ambulatory Visit: Payer: Medicare HMO | Admitting: Anesthesiology

## 2020-05-06 ENCOUNTER — Encounter: Payer: Self-pay | Admitting: Gastroenterology

## 2020-05-06 ENCOUNTER — Ambulatory Visit
Admission: RE | Admit: 2020-05-06 | Discharge: 2020-05-06 | Disposition: A | Discharge status: Home / Self Care | Payer: Medicare HMO | Source: Ambulatory Visit | Attending: Gastroenterology | Admitting: Gastroenterology

## 2020-05-06 ENCOUNTER — Encounter
Admission: RE | Disposition: A | Discharge status: Home / Self Care | Payer: Self-pay | Source: Ambulatory Visit | Attending: Gastroenterology

## 2020-05-06 DIAGNOSIS — K6289 Other specified diseases of anus and rectum: Secondary | ICD-10-CM | POA: Insufficient documentation

## 2020-05-06 DIAGNOSIS — D122 Benign neoplasm of ascending colon: Secondary | ICD-10-CM | POA: Diagnosis not present

## 2020-05-06 DIAGNOSIS — Z9049 Acquired absence of other specified parts of digestive tract: Secondary | ICD-10-CM | POA: Insufficient documentation

## 2020-05-06 DIAGNOSIS — D120 Benign neoplasm of cecum: Secondary | ICD-10-CM | POA: Insufficient documentation

## 2020-05-06 DIAGNOSIS — K5730 Diverticulosis of large intestine without perforation or abscess without bleeding: Secondary | ICD-10-CM | POA: Insufficient documentation

## 2020-05-06 DIAGNOSIS — K319 Disease of stomach and duodenum, unspecified: Secondary | ICD-10-CM | POA: Insufficient documentation

## 2020-05-06 DIAGNOSIS — K222 Esophageal obstruction: Secondary | ICD-10-CM

## 2020-05-06 DIAGNOSIS — R1319 Other dysphagia: Secondary | ICD-10-CM

## 2020-05-06 DIAGNOSIS — D124 Benign neoplasm of descending colon: Secondary | ICD-10-CM | POA: Diagnosis not present

## 2020-05-06 DIAGNOSIS — Z87891 Personal history of nicotine dependence: Secondary | ICD-10-CM | POA: Diagnosis not present

## 2020-05-06 DIAGNOSIS — Z79890 Hormone replacement therapy (postmenopausal): Secondary | ICD-10-CM | POA: Insufficient documentation

## 2020-05-06 DIAGNOSIS — R1310 Dysphagia, unspecified: Secondary | ICD-10-CM | POA: Insufficient documentation

## 2020-05-06 DIAGNOSIS — Z888 Allergy status to other drugs, medicaments and biological substances status: Secondary | ICD-10-CM | POA: Insufficient documentation

## 2020-05-06 DIAGNOSIS — K253 Acute gastric ulcer without hemorrhage or perforation: Secondary | ICD-10-CM

## 2020-05-06 DIAGNOSIS — K648 Other hemorrhoids: Secondary | ICD-10-CM | POA: Insufficient documentation

## 2020-05-06 DIAGNOSIS — Z1211 Encounter for screening for malignant neoplasm of colon: Secondary | ICD-10-CM

## 2020-05-06 DIAGNOSIS — K635 Polyp of colon: Secondary | ICD-10-CM

## 2020-05-06 DIAGNOSIS — Z79899 Other long term (current) drug therapy: Secondary | ICD-10-CM | POA: Diagnosis not present

## 2020-05-06 DIAGNOSIS — K219 Gastro-esophageal reflux disease without esophagitis: Secondary | ICD-10-CM | POA: Insufficient documentation

## 2020-05-06 HISTORY — DX: Hypothyroidism, unspecified: E03.9

## 2020-05-06 HISTORY — PX: COLONOSCOPY WITH PROPOFOL: SHX5780

## 2020-05-06 HISTORY — DX: Sleep apnea, unspecified: G47.30

## 2020-05-06 HISTORY — PX: ESOPHAGOGASTRODUODENOSCOPY (EGD) WITH PROPOFOL: SHX5813

## 2020-05-06 HISTORY — DX: Gastro-esophageal reflux disease without esophagitis: K21.9

## 2020-05-06 SURGERY — COLONOSCOPY WITH PROPOFOL
Anesthesia: General

## 2020-05-06 MED ORDER — PROPOFOL 10 MG/ML IV BOLUS
INTRAVENOUS | Status: AC
  Filled 2020-05-06: qty 20

## 2020-05-06 MED ORDER — PROPOFOL 500 MG/50ML IV EMUL
INTRAVENOUS | Status: DC | PRN
  Administered 2020-05-06: 180 ug/kg/min via INTRAVENOUS

## 2020-05-06 MED ORDER — PROPOFOL 10 MG/ML IV BOLUS
INTRAVENOUS | Status: DC | PRN
  Administered 2020-05-06: 40 ug via INTRAVENOUS

## 2020-05-06 MED ORDER — PROPOFOL 500 MG/50ML IV EMUL
INTRAVENOUS | Status: AC
  Filled 2020-05-06: qty 50

## 2020-05-06 MED ORDER — SODIUM CHLORIDE 0.9 % IV SOLN: INTRAVENOUS | Status: DC

## 2020-05-06 MED ORDER — LIDOCAINE HCL (CARDIAC) PF 100 MG/5ML IV SOSY
PREFILLED_SYRINGE | INTRAVENOUS | Status: DC | PRN
  Administered 2020-05-06: 100 mg via INTRAVENOUS

## 2020-05-06 NOTE — Transfer of Care (Signed)
Immediate Anesthesia Transfer of Care Note  Patient: Allison Paul  Procedure(s) Performed: COLONOSCOPY WITH PROPOFOL (N/A ) ESOPHAGOGASTRODUODENOSCOPY (EGD) WITH PROPOFOL (N/A )  Patient Location: PACU  Anesthesia Type:General  Level of Consciousness: awake, alert  and oriented  Airway & Oxygen Therapy: Patient Spontanous Breathing and Patient connected to nasal cannula oxygen  Post-op Assessment: Report given to RN and Post -op Vital signs reviewed and stable  Post vital signs: Reviewed and stable  Last Vitals:  Vitals Value Taken Time  BP 100/51 05/06/20 1044  Temp 36.4 C 05/06/20 1043  Pulse 127 05/06/20 1052  Resp 15 05/06/20 1052  SpO2 96 % 05/06/20 1052  Vitals shown include unvalidated device data.  Last Pain:  Vitals:   05/06/20 1043  TempSrc: Temporal  PainSc: 0-No pain         Complications: No complications documented.

## 2020-05-06 NOTE — H&P (Signed)
Vonda Antigua, MD [address], [place], [place], [place], [address] [address], [place], [place], [place], [address] Phone: [phone]  Fax: [phone]  Primary Care Physician:  [facility]   Pre-Procedure History & Physical: HPI:  Allison Paul is a 60 y.o. female is here for a colonoscopy and EGD.   Past Medical History:  Diagnosis Date  . Depressed   . GERD (gastroesophageal reflux disease)   . Hypertension   . Hypothyroidism   . Sleep apnea   . Thyroid disease     Past Surgical History:  Procedure Laterality Date  . arthoscopic knee     BIL  . CERVICAL FUSION     april 2013  . CHOLECYSTECTOMY    . TUBAL LIGATION      Prior to Admission medications   Medication Sig Start Date End Date Taking? Authorizing Provider  levothyroxine (SYNTHROID) 137 MCG tablet Take 1 tablet by mouth daily. 08/15/19 08/14/20 Yes [provider]  metoprolol (LOPRESSOR) 50 MG tablet Take 50 mg by mouth every morning.   Yes [provider]  omeprazole (PRILOSEC) 20 MG capsule 1 capsule daily. 03/12/20  Yes [provider]  triamterene-hydrochlorothiazide (DYAZIDE) 37.5-25 MG capsule Take 1 capsule by mouth daily. 10/04/19  Yes [provider]  azelastine (ASTELIN) 0.1 % nasal spray Place 1 spray into both nostrils 1 day or 1 dose. 02/20/20   [provider]  Cholecalciferol (VITAMIN D3) 1.25 MG (50000 UT) CAPS Take 2 capsules by mouth daily.    [provider]  FISH OIL-BORAGE-FLAX-SAFFLOWER PO Take 2 capsules by mouth daily.    [provider]  Glucosamine Sulfate 500 MG TABS Take 1 tablet by mouth daily.    [provider]  ibuprofen (ADVIL,MOTRIN) 800 MG tablet Take 800 mg by mouth every 8 (eight) hours as needed. pain    [provider]  Multiple Vitamin (MULTI-VITAMIN) tablet Take 1 tablet by mouth daily.    [provider]  Na Sulfate-K Sulfate-Mg Sulf 17.5-3.13-1.6  GM/177ML SOLN At 5 PM the day before procedure take 1 bottle and 5 hours before procedure take 1 bottle. 04/24/20   Virgel Manifold, MD  naproxen (NAPROSYN) 500 MG tablet Take 1 tablet by mouth daily. 08/20/16   [provider]  polyethylene glycol-electrolytes (NULYTELY) 420 g solution Prepare according to package instructions. Starting at 5:00 PM: Drink one 8 oz glass of mixture every 15 minutes until you finish half of the jug. Five hours prior to procedure, drink 8 oz glass of mixture every 15 minutes until it is all gone. Make sure you do not drink anything 4 hours prior to your procedure. 04/29/20   Virgel Manifold, MD  saccharomyces boulardii (FLORASTOR) 250 MG capsule Take 1 capsule by mouth daily.    [provider]    Allergies as of 04/24/2020 - Review Complete 04/24/2020  Allergen Reaction Noted  . Prednisone  02/18/2020    History reviewed. No pertinent family history.  Social History   Socioeconomic History  . Marital status: Divorced    Spouse name: Not on file  . Number of children: Not on file  . Years of education: Not on file  . Highest education level: Not on file  Occupational History  . Not on file  Tobacco Use  . Smoking status: Former Smoker    Types: Cigarettes    Quit date: 12/06/2014    Years since quitting: 5.4  . Smokeless tobacco: Never Used  Vaping Use  . Vaping Use: Never used  Substance and Sexual Activity  . Alcohol use: Yes    Comment: Occassionally  . Drug use: No  . Sexual activity: Not on file  Other Topics Concern  . Not on file  Social History Narrative  . Not on file   Social Determinants of Health   Financial Resource Strain: Not on file  Food Insecurity: Not on file  Transportation Needs: Not on file  Physical Activity: Not on file  Stress: Not on file  Social Connections: Not on file  Intimate Partner Violence: Not on file    Review of Systems: See HPI, otherwise negative ROS  Physical Exam: BP  136/87   Pulse 69   Temp 97.8 F (36.6 C) (Tympanic)   Resp 18   Ht 5\' 3"  (1.6 m)   Wt 120.2 kg   SpO2 100%   BMI 46.94 kg/m  General:   Alert,  pleasant and cooperative in NAD Head:  Normocephalic and atraumatic. Neck:  Supple; no masses or thyromegaly. Lungs:  Clear throughout to auscultation, normal respiratory effort.    Heart:  +S1, +S2, Regular rate and rhythm, No edema. Abdomen:  Soft, nontender and nondistended. Normal bowel sounds, without guarding, and without rebound.   Neurologic:  Alert and  oriented x4;  grossly normal neurologically.  Impression/Plan: Allison Paul is here for a colonoscopy to be performed for average risk screening and EGD for Acid Reflux, and dysphagia  Risks, benefits, limitations, and alternatives regarding the procedures have been reviewed with the patient.  Questions have been answered.  All parties agreeable.   Virgel Manifold, MD  05/06/2020, 9:17 AM

## 2020-05-06 NOTE — Anesthesia Preprocedure Evaluation (Signed)
Anesthesia Evaluation  Patient identified by MRN, date of birth, ID band Patient awake    Reviewed: Allergy & Precautions, H&P , NPO status , Patient's Chart, lab work & pertinent test results  History of Anesthesia Complications (+) PONV and history of anesthetic complications  Airway Mallampati: III  TM Distance: <3 FB Neck ROM: full    Dental  (+) Chipped   Pulmonary neg shortness of breath, sleep apnea , former smoker,    Pulmonary exam normal        Cardiovascular Exercise Tolerance: Good hypertension, (-) angina(-) Past MI Normal cardiovascular exam     Neuro/Psych PSYCHIATRIC DISORDERS  Neuromuscular disease negative psych ROS   GI/Hepatic Neg liver ROS, GERD  Medicated and Controlled,  Endo/Other  Hypothyroidism   Renal/GU Renal disease  negative genitourinary   Musculoskeletal   Abdominal   Peds  Hematology negative hematology ROS (+)   Anesthesia Other Findings Past Medical History: No date: Depressed No date: GERD (gastroesophageal reflux disease) No date: Hypertension No date: Hypothyroidism No date: Sleep apnea No date: Thyroid disease  Past Surgical History: No date: arthoscopic knee     Comment:  BIL No date: CERVICAL FUSION     Comment:  april 2013 No date: CHOLECYSTECTOMY No date: TUBAL LIGATION  BMI    Body Mass Index: 46.94 kg/m      Reproductive/Obstetrics negative OB ROS                             Anesthesia Physical Anesthesia Plan  ASA: III  Anesthesia Plan: General   Post-op Pain Management:    Induction: Intravenous  PONV Risk Score and Plan: Propofol infusion and TIVA  Airway Management Planned: Natural Airway and Nasal Cannula  Additional Equipment:   Intra-op Plan:   Post-operative Plan:   Informed Consent: I have reviewed the patients History and Physical, chart, labs and discussed the procedure including the risks, benefits and  alternatives for the proposed anesthesia with the patient or authorized representative who has indicated his/her understanding and acceptance.     Dental Advisory Given  Plan Discussed with: Anesthesiologist, CRNA and Surgeon  Anesthesia Plan Comments: (Patient consented for risks of anesthesia including but not limited to:  - adverse reactions to medications - risk of airway placement if required - damage to eyes, teeth, lips or other oral mucosa - nerve damage due to positioning  - sore throat or hoarseness - Damage to heart, brain, nerves, lungs, other parts of body or loss of life  Patient voiced understanding.)        Anesthesia Quick Evaluation

## 2020-05-06 NOTE — Anesthesia Postprocedure Evaluation (Signed)
Anesthesia Post Note  Patient: Allison Paul  Procedure(s) Performed: COLONOSCOPY WITH PROPOFOL (N/A ) ESOPHAGOGASTRODUODENOSCOPY (EGD) WITH PROPOFOL (N/A )  Patient location during evaluation: Endoscopy Anesthesia Type: General Level of consciousness: awake and alert Pain management: pain level controlled Vital Signs Assessment: post-procedure vital signs reviewed and stable Respiratory status: spontaneous breathing, nonlabored ventilation, respiratory function stable and patient connected to nasal cannula oxygen Cardiovascular status: blood pressure returned to baseline and stable Postop Assessment: no apparent nausea or vomiting Anesthetic complications: no   No complications documented.   Last Vitals:  Vitals:   05/06/20 1043 05/06/20 1103  BP: (!) 100/51 97/79  Pulse: 63   Resp: 13   Temp: 36.4 C   SpO2: 100%     Last Pain:  Vitals:   05/06/20 1113  TempSrc:   PainSc: 0-No pain                 Precious Haws Desirae Mancusi

## 2020-05-06 NOTE — Op Note (Signed)
[facility] Gastroenterology Patient Name: Allison Paul Procedure Date: 05/06/2020 9:22 AM MRN: [id number] Account #: [id number] Date of Birth: 06/24/1960 Admit Type: Outpatient Age: 60 Room: [facility] ENDO ROOM 2 Gender: Female Note Status: Finalized Procedure:             Upper GI endoscopy Indications:           Dysphagia, Heartburn Providers:             Lesieli Bresee B. Bonna Gains MD, MD Referring MD:          Forest Gleason Md, MD (Referring MD) Medicines:             Monitored Anesthesia Care Complications:         No immediate complications. Procedure:             Pre-Anesthesia Assessment:                        - The risks and benefits of the procedure and the                         sedation options and risks were discussed with the                         patient. All questions were answered and informed                         consent was obtained.                        - Patient identification and proposed procedure were                         verified prior to the procedure.                        - ASA Grade Assessment: II - A patient with mild                         systemic disease.                        After obtaining informed consent, the endoscope was                         passed under direct vision. Throughout the procedure,                         the patient's blood pressure, pulse, and oxygen                         saturations were monitored continuously. The Endoscope                         was introduced through the mouth, and advanced to the                         second part of duodenum. The upper GI endoscopy was                         accomplished with  ease. The patient tolerated the                         procedure well. Findings:      A mild Schatzki ring was found at the gastroesophageal junction. A TTS       dilator was passed through the scope. Dilation with an 18-19-20 mm       balloon dilator was performed to 20 mm.      There  is no endoscopic evidence of mucosal abnormalities or       salmon-colored mucosa in the entire esophagus. Biopsies were obtained       from the proximal and distal esophagus with cold forceps for histology       of suspected eosinophilic esophagitis.      Two 4 mm erosions with no bleeding and no stigmata of recent bleeding       were found in the gastric antrum. Biopsies were obtained in the gastric       body, at the incisura and in the gastric antrum with cold forceps for       Helicobacter pylori testing.      The exam of the stomach was otherwise normal.      The examined duodenum was normal. Impression:            - Mild Schatzki ring. Dilated.                        - Erosive gastropathy with no bleeding and no stigmata                         of recent bleeding.                        - Normal examined duodenum.                        - Biopsies were obtained in the gastric body, at the                         incisura and in the gastric antrum. Recommendation:        - Await pathology results.                        - Discharge patient to home.                        - Continue present medications.                        - Resume previous diet.                        - The findings and recommendations were discussed with                         the patient.                        - The findings and recommendations were discussed with                         the patient's family.                        -  Return to primary care physician as previously                         scheduled.                        - Follow an antireflux regimen. Procedure Code(s):     --- Professional ---                        [id number], Esophagogastroduodenoscopy, flexible,                         transoral; with transendoscopic balloon dilation of                         esophagus (less than 30 mm diameter)                        43239, 59, Esophagogastroduodenoscopy, flexible,                          transoral; with biopsy, single or multiple Diagnosis Code(s):     --- Professional ---                        K22.2, Esophageal obstruction                        K31.89, Other diseases of stomach and duodenum                        R13.10, Dysphagia, unspecified                        R12, Heartburn CPT copyright 2019 American Medical Association. All rights reserved. The codes documented in this report are preliminary and upon coder review may  be revised to meet current compliance requirements.  Vonda Antigua, MD Margretta Sidle B. Bonna Gains MD, MD 05/06/2020 10:00:33 AM This report has been signed electronically. Number of Addenda: 0 Note Initiated On: 05/06/2020 9:22 AM Estimated Blood Loss:  Estimated blood loss: none.      [facility]

## 2020-05-06 NOTE — Anesthesia Procedure Notes (Signed)
Performed by: Teddi Badalamenti, CRNA       

## 2020-05-06 NOTE — Anesthesia Procedure Notes (Signed)
Date/Time: 05/06/2020 9:25 AM Performed by: Allean Found, CRNA Pre-anesthesia Checklist: Patient identified, Emergency Drugs available, Suction available, Patient being monitored and Timeout performed Patient Re-evaluated:Patient Re-evaluated prior to induction Oxygen Delivery Method: Nasal cannula Placement Confirmation: positive ETCO2

## 2020-05-06 NOTE — Anesthesia Procedure Notes (Signed)
Date/Time: 05/06/2020 10:00 AM Performed by: Allean Found, CRNA Pre-anesthesia Checklist: Patient identified, Emergency Drugs available, Suction available, Patient being monitored and Timeout performed Oxygen Delivery Method: Nasal cannula Ventilation: Nasal airway inserted- appropriate to patient size Placement Confirmation: positive ETCO2

## 2020-05-06 NOTE — Op Note (Signed)
[facility] Gastroenterology Patient Name: Allison Paul Procedure Date: 05/06/2020 9:21 AM MRN: [id number] Account #: [id number] Date of Birth: 1961-01-03 Admit Type: Outpatient Age: 60 Room: [facility] ENDO ROOM 2 Gender: Female Note Status: Finalized Procedure:             Colonoscopy Indications:           Screening for colorectal malignant neoplasm Providers:             Flossie Wexler B. Bonna Gains MD, MD Referring MD:          Forest Gleason Md, MD (Referring MD) Medicines:             Monitored Anesthesia Care Complications:         No immediate complications. Procedure:             Pre-Anesthesia Assessment:                        - ASA Grade Assessment: II - A patient with mild                         systemic disease.                        - Prior to the procedure, a History and Physical was                         performed, and patient medications, allergies and                         sensitivities were reviewed. The patient's tolerance                         of previous anesthesia was reviewed.                        - The risks and benefits of the procedure and the                         sedation options and risks were discussed with the                         patient. All questions were answered and informed                         consent was obtained.                        - Patient identification and proposed procedure were                         verified prior to the procedure by the physician, the                         nurse, the anesthesiologist, the anesthetist and the                         technician. The procedure was verified in the                         procedure  room.                        After obtaining informed consent, the colonoscope was                         passed under direct vision. Throughout the procedure,                         the patient's blood pressure, pulse, and oxygen                         saturations were monitored  continuously. The                         Colonoscope was introduced through the anus and                         advanced to the the cecum, identified by appendiceal                         orifice and ileocecal valve. The colonoscopy was                         performed with ease. The patient tolerated the                         procedure well. The quality of the bowel preparation                         was good. Findings:      Hemorrhoids were found on perianal exam.      Two sessile polyps were found in the ascending colon and cecum. The       polyps were 5 to 7 mm in size. These polyps were removed with a cold       snare. Resection and retrieval were complete.      Three sessile polyps were found in the sigmoid colon and descending       colon. The polyps were 5 to 8 mm in size. These polyps were removed with       a cold snare. Resection and retrieval were complete.      Multiple diverticula were found in the sigmoid colon.      The exam was otherwise without abnormality.      The rectum, sigmoid colon, descending colon, transverse colon, ascending       colon and cecum appeared normal.      Non-bleeding internal hemorrhoids were found during retroflexion.      Anal papilla(e) were hypertrophied.      No additional abnormalities were found on retroflexion. Impression:            - Hemorrhoids found on perianal exam.                        - Two 5 to 7 mm polyps in the ascending colon and in                         the cecum, removed with a cold snare. Resected and  retrieved.                        - Three 5 to 8 mm polyps in the sigmoid colon and in                         the descending colon, removed with a cold snare.                         Resected and retrieved.                        - Diverticulosis in the sigmoid colon.                        - The examination was otherwise normal.                        - The rectum, sigmoid colon, descending  colon,                         transverse colon, ascending colon and cecum are normal.                        - Non-bleeding internal hemorrhoids.                        - Anal papilla(e) were hypertrophied. Recommendation:        - Discharge patient to home (with escort).                        - High fiber diet.                        - Advance diet as tolerated.                        - Continue present medications.                        - Await pathology results.                        - Repeat colonoscopy date to be determined after                         pending pathology results are reviewed.                        - The findings and recommendations were discussed with                         the patient.                        - The findings and recommendations were discussed with                         the patient's family.                        - Return to primary care physician  as previously                         scheduled. Procedure Code(s):     --- Professional ---                        [phone], Colonoscopy, flexible; with removal of                         tumor(s), polyp(s), or other lesion(s) by snare                         technique Diagnosis Code(s):     --- Professional ---                        Z12.11, Encounter for screening for malignant neoplasm                         of colon                        K63.5, Polyp of colon CPT copyright 2019 American Medical Association. All rights reserved. The codes documented in this report are preliminary and upon coder review may  be revised to meet current compliance requirements.  Vonda Antigua, MD Margretta Sidle B. Bonna Gains MD, MD 05/06/2020 10:40:37 AM This report has been signed electronically. Number of Addenda: 0 Note Initiated On: 05/06/2020 9:21 AM Scope Withdrawal Time: 0 hours 24 minutes 21 seconds  Total Procedure Duration: 0 hours 29 minutes 0 seconds  Estimated Blood Loss:  Estimated blood loss: none.       [facility]

## 2020-05-07 ENCOUNTER — Encounter: Payer: Self-pay | Admitting: Gastroenterology

## 2020-05-07 LAB — SURGICAL PATHOLOGY

## 2020-05-14 ENCOUNTER — Telehealth: Payer: Self-pay

## 2020-05-14 NOTE — Telephone Encounter (Signed)
Patient was contacted to let her know that her biopsy was normal. Patient understood and had no further questions.

## 2020-05-14 NOTE — Telephone Encounter (Signed)
Patient would like to know the results her most recent stomach biopsy? Received her colon screening results. Please call the patient.

## 2020-05-17 ENCOUNTER — Other Ambulatory Visit: Payer: Self-pay

## 2020-05-17 ENCOUNTER — Ambulatory Visit
Admission: RE | Admit: 2020-05-17 | Discharge: 2020-05-17 | Disposition: A | Discharge status: Home / Self Care | Payer: Medicare HMO | Source: Ambulatory Visit | Attending: Family Medicine | Admitting: Family Medicine

## 2020-05-17 DIAGNOSIS — R42 Dizziness and giddiness: Secondary | ICD-10-CM | POA: Diagnosis present

## 2020-05-17 DIAGNOSIS — R200 Anesthesia of skin: Secondary | ICD-10-CM | POA: Diagnosis present

## 2020-06-06 ENCOUNTER — Other Ambulatory Visit: Payer: Self-pay

## 2020-06-06 ENCOUNTER — Encounter: Payer: Self-pay | Admitting: Gastroenterology

## 2020-06-06 ENCOUNTER — Ambulatory Visit: Payer: Medicare HMO | Admitting: Gastroenterology

## 2020-06-06 VITALS — BP 141/88 | HR 79 | Temp 98.5°F | Wt 269.4 lb

## 2020-06-06 DIAGNOSIS — K219 Gastro-esophageal reflux disease without esophagitis: Secondary | ICD-10-CM | POA: Diagnosis not present

## 2020-06-06 DIAGNOSIS — R1319 Other dysphagia: Secondary | ICD-10-CM | POA: Diagnosis not present

## 2020-06-06 NOTE — Patient Instructions (Signed)
Discontinue your prilosec after 30 days.

## 2020-06-06 NOTE — Progress Notes (Signed)
Vonda Antigua, MD [address]  [place]  [place], [place] [address]  Main: [phone]  Fax: [phone]   Primary Care Physician: Valera Castle, MD  Chief complaint: Reflux, dysphagia  HPI: Allison Paul is a 60 y.o. female with history of dysphagia here for follow-up.  Patient underwent EGD with dilation of Schatzki's ring to 20 mm.  Patient states she thinks that symptoms are better but attributes this to Prilosec more than the dilation.  She does state that she has recently been more anxious, and attributes a lot of her symptoms to her anxiety.  States she is trying to lose weight to help with her symptoms as well.   Current Outpatient Medications  Medication Sig Dispense Refill  . Cholecalciferol (VITAMIN D3) 1.25 MG (50000 UT) CAPS Take 2 capsules by mouth daily.    Marland Kitchen FISH OIL-BORAGE-FLAX-SAFFLOWER PO Take 2 capsules by mouth daily.    Marland Kitchen levothyroxine (SYNTHROID) 137 MCG tablet Take 1 tablet by mouth daily.    . metoprolol (LOPRESSOR) 50 MG tablet Take 50 mg by mouth every morning.    . Multiple Vitamin (MULTI-VITAMIN) tablet Take 1 tablet by mouth daily.    Marland Kitchen omeprazole (PRILOSEC) 20 MG capsule 1 capsule daily.    Marland Kitchen saccharomyces boulardii (FLORASTOR) 250 MG capsule Take 1 capsule by mouth daily.    Marland Kitchen triamterene-hydrochlorothiazide (DYAZIDE) 37.5-25 MG capsule Take 1 capsule by mouth daily.     No current facility-administered medications for this visit.    Allergies as of 06/06/2020 - Review Complete 06/06/2020  Allergen Reaction Noted  . Prednisone  02/18/2020    ROS:  General: Negative for anorexia, weight loss, fever, chills, fatigue, weakness. ENT: Negative for hoarseness, difficulty swallowing , nasal congestion. CV: Negative for chest pain, angina, palpitations, dyspnea on exertion, peripheral edema.  Respiratory: Negative for dyspnea at rest, dyspnea on exertion, cough, sputum, wheezing.  GI: See history of present illness. GU:   Negative for dysuria, hematuria, urinary incontinence, urinary frequency, nocturnal urination.  Endo: Negative for unusual weight change.    Physical Examination:   BP (!) 141/88   Pulse 79   Temp 98.5 F (36.9 C) (Oral)   Wt 269 lb 6.4 oz (122.2 kg)   BMI 47.72 kg/m   General: Well-nourished, well-developed in no acute distress.  Eyes: No icterus. Conjunctivae pink. Mouth: Oropharyngeal mucosa moist and pink , no lesions erythema or exudate. Neck: Supple, Trachea midline Abdomen: Bowel sounds are normal, nontender, nondistended, no hepatosplenomegaly or masses, no abdominal bruits or hernia , no rebound or guarding.   Extremities: No lower extremity edema. No clubbing or deformities. Neuro: Alert and oriented x 3.  Grossly intact. Skin: Warm and dry, no jaundice.   Psych: Alert and cooperative, normal mood and affect.   Labs: CMP     Component Value Date/Time   NA 138 02/07/2015 1410   NA 136 04/09/2014 1439   K 4.2 02/07/2015 1410   K 3.7 04/09/2014 1439   CL 102 02/07/2015 1410   CL 102 04/09/2014 1439   CO2 24 02/07/2015 1410   CO2 24 04/09/2014 1439   GLUCOSE 89 02/07/2015 1410   GLUCOSE 97 04/09/2014 1439   BUN 16 02/07/2015 1410   BUN 26 (H) 04/09/2014 1439   CREATININE 1.12 (H) 02/07/2015 1410   CREATININE 0.97 04/09/2014 1439   CALCIUM 9.8 02/07/2015 1410   CALCIUM 9.6 04/09/2014 1439   PROT 7.9 02/07/2015 1410   PROT 7.5 08/09/2012 0218  ALBUMIN 4.2 02/07/2015 1410   ALBUMIN 3.4 08/09/2012 0218   AST 26 02/07/2015 1410   AST 33 08/09/2012 0218   ALT 25 02/07/2015 1410   ALT 35 08/09/2012 0218   ALKPHOS 62 02/07/2015 1410   ALKPHOS 73 08/09/2012 0218   BILITOT 0.8 02/07/2015 1410   BILITOT 0.2 08/09/2012 0218   GFRNONAA 55 (L) 02/07/2015 1410   GFRNONAA >60 04/09/2014 1439   GFRAA >60 02/07/2015 1410   GFRAA >60 04/09/2014 1439   Lab Results  Component Value Date   WBC 10.2 02/07/2015   HGB 15.4 02/07/2015   HCT 46.3 02/07/2015   MCV 93.2  02/07/2015   PLT 275 02/07/2015    Imaging Studies: US Carotid Bilateral  Result Date: 05/17/2020 CLINICAL DATA:  60 year old female with dizziness, facial numbness. EXAM: BILATERAL CAROTID DUPLEX ULTRASOUND TECHNIQUE: Pearline Cables scale imaging, color Doppler and duplex ultrasound were performed of bilateral carotid and vertebral arteries in the neck. COMPARISON:  None. FINDINGS: Criteria: Quantification of carotid stenosis is based on velocity parameters that correlate the residual internal carotid diameter with NASCET-based stenosis levels, using the diameter of the distal internal carotid lumen as the denominator for stenosis measurement. The following velocity measurements were obtained: RIGHT ICA: Peak systolic velocity 75 cm/sec, End diastolic velocity 23 cm/sec CCA: Peak systolic velocity 69 cm/sec SYSTOLIC ICA/CCA RATIO:  1.2 ECA: Peak systolic velocity 83 cm/sec LEFT ICA: Peak systolic velocity 63 cm/sec, End diastolic velocity 23 cm/sec CCA: 73 cm/sec SYSTOLIC ICA/CCA RATIO:  1.7 ECA: 72 cm/sec RIGHT CAROTID ARTERY: Minimal focal atherosclerotic plaque formation in the carotid bulb. No significant tortuosity. Normal low resistance waveforms. RIGHT VERTEBRAL ARTERY:  Antegrade flow. LEFT CAROTID ARTERY: Minimal focal atherosclerotic plaque formation in the carotid bulb. No significant tortuosity. Normal low resistance waveforms. LEFT VERTEBRAL ARTERY:  Antegrade flow. Upper extremity non-invasive blood pressures: Not obtained. IMPRESSION: 1. Right carotid artery system: Less than 50% stenosis secondary to minimal focal atherosclerotic plaque formation in the carotid bulb. 2. Left carotid artery system: Less than 50% stenosis secondary to minimal focal atherosclerotic plaque formation in the carotid bulb. 3.  Vertebral artery system: Patent with antegrade flow bilaterally. Ruthann Cancer, MD [facility] Radiology Electronically Signed   By: Ruthann Cancer MD    On: 05/17/2020 15:40    Assessment and Plan:   Allison Paul is a 60 y.o. y/o female here for follow-up of reflux and dysphagia  Symptoms getting better with Prilosec  Patient educated extensively on acid reflux lifestyle modification, including buying a bed wedge, not eating 3 hrs before bedtime, diet modifications, and handout given for the same.   Continue Prilosec for another month and then discontinue and if symptoms return, can consider resuming or H2 RA therapy  Given that dilation was to 20 mm as opposed to a much smaller size of dilation, and symptoms are much better with PPI, dysphagia is unlikely from the Schatzki's ring if it were to recur and some of her symptoms may be attributed to reflux, versus anxiety  If symptoms continue, consider pH study  Patient educated extensively on acid reflux lifestyle modification, including buying a bed wedge, not eating 3 hrs before bedtime, diet modifications, and handout given for the same.   Follow-up in 2 to 3 months or earlier if necessary    Dr Vonda Antigua

## 2020-09-18 ENCOUNTER — Ambulatory Visit: Payer: Medicare HMO | Admitting: Gastroenterology

## 2021-01-25 ENCOUNTER — Other Ambulatory Visit: Payer: Self-pay

## 2021-01-25 ENCOUNTER — Encounter: Payer: Self-pay | Admitting: Emergency Medicine

## 2021-01-25 DIAGNOSIS — S39012A Strain of muscle, fascia and tendon of lower back, initial encounter: Secondary | ICD-10-CM | POA: Insufficient documentation

## 2021-01-25 DIAGNOSIS — N183 Chronic kidney disease, stage 3 unspecified: Secondary | ICD-10-CM | POA: Insufficient documentation

## 2021-01-25 DIAGNOSIS — Z79899 Other long term (current) drug therapy: Secondary | ICD-10-CM | POA: Insufficient documentation

## 2021-01-25 DIAGNOSIS — E039 Hypothyroidism, unspecified: Secondary | ICD-10-CM | POA: Diagnosis not present

## 2021-01-25 DIAGNOSIS — R519 Headache, unspecified: Secondary | ICD-10-CM | POA: Diagnosis not present

## 2021-01-25 DIAGNOSIS — M79602 Pain in left arm: Secondary | ICD-10-CM | POA: Diagnosis not present

## 2021-01-25 DIAGNOSIS — I129 Hypertensive chronic kidney disease with stage 1 through stage 4 chronic kidney disease, or unspecified chronic kidney disease: Secondary | ICD-10-CM | POA: Diagnosis not present

## 2021-01-25 DIAGNOSIS — Y92410 Unspecified street and highway as the place of occurrence of the external cause: Secondary | ICD-10-CM | POA: Insufficient documentation

## 2021-01-25 DIAGNOSIS — S161XXA Strain of muscle, fascia and tendon at neck level, initial encounter: Secondary | ICD-10-CM | POA: Diagnosis not present

## 2021-01-25 DIAGNOSIS — M542 Cervicalgia: Secondary | ICD-10-CM | POA: Diagnosis present
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
## 2021-01-26 ENCOUNTER — Emergency Department
Admission: EM | Admit: 2021-01-26 | Discharge: 2021-01-26 | Disposition: A | Discharge status: Home / Self Care | Payer: Medicare HMO | Attending: Emergency Medicine | Admitting: Emergency Medicine

## 2021-01-26 ENCOUNTER — Emergency Department: Payer: Medicare HMO

## 2021-01-26 DIAGNOSIS — M79602 Pain in left arm: Secondary | ICD-10-CM

## 2021-01-26 DIAGNOSIS — S161XXA Strain of muscle, fascia and tendon at neck level, initial encounter: Secondary | ICD-10-CM

## 2021-01-26 DIAGNOSIS — S39012A Strain of muscle, fascia and tendon of lower back, initial encounter: Secondary | ICD-10-CM

## 2021-01-26 MED ORDER — KETOROLAC TROMETHAMINE 60 MG/2ML IM SOLN
30.0000 mg | Freq: Once | INTRAMUSCULAR | Status: AC
  Administered 2021-01-26: 30 mg via INTRAMUSCULAR
  Filled 2021-01-26: qty 2

## 2021-01-26 MED ORDER — HYDROCODONE-ACETAMINOPHEN 5-325 MG PO TABS
1.0000 | ORAL_TABLET | Freq: Once | ORAL | Status: AC
  Administered 2021-01-26: 1 via ORAL
  Filled 2021-01-26: qty 1

## 2021-01-26 MED ORDER — DIAZEPAM 2 MG PO TABS
2.0000 mg | ORAL_TABLET | Freq: Once | ORAL | Status: AC
  Administered 2021-01-26: 2 mg via ORAL
  Filled 2021-01-26: qty 1

## 2021-01-26 MED ORDER — HYDROCODONE-ACETAMINOPHEN 5-325 MG PO TABS: 1.0000 | ORAL_TABLET | Freq: Four times a day (QID) | ORAL | 0 refills | Status: DC | PRN

## 2021-01-26 MED ORDER — CYCLOBENZAPRINE HCL 5 MG PO TABS: ORAL_TABLET | ORAL | 0 refills | Status: DC
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
## 2021-01-26 NOTE — Discharge Instructions (Signed)
You may take Ibuprofen as needed for pain, Norco as needed for stronger pain.  Flexeril as needed for muscle spasms.  Return to the ER for worsening symptoms, persistent vomiting, difficulty breathing or other concerns.

## 2021-05-05 ENCOUNTER — Other Ambulatory Visit: Payer: Self-pay | Admitting: Family Medicine

## 2021-05-05 DIAGNOSIS — Z1231 Encounter for screening mammogram for malignant neoplasm of breast: Secondary | ICD-10-CM

## 2022-02-11 ENCOUNTER — Emergency Department: Payer: Medicare HMO

## 2022-02-11 ENCOUNTER — Emergency Department
Admission: EM | Admit: 2022-02-11 | Discharge: 2022-02-11 | Disposition: A | Discharge status: Home / Self Care | Payer: Medicare HMO | Attending: Emergency Medicine | Admitting: Emergency Medicine

## 2022-02-11 ENCOUNTER — Encounter: Payer: Self-pay | Admitting: Emergency Medicine

## 2022-02-11 ENCOUNTER — Other Ambulatory Visit: Payer: Self-pay

## 2022-02-11 DIAGNOSIS — I10 Essential (primary) hypertension: Secondary | ICD-10-CM | POA: Diagnosis not present

## 2022-02-11 DIAGNOSIS — K5792 Diverticulitis of intestine, part unspecified, without perforation or abscess without bleeding: Secondary | ICD-10-CM

## 2022-02-11 DIAGNOSIS — R109 Unspecified abdominal pain: Secondary | ICD-10-CM | POA: Diagnosis present

## 2022-02-11 DIAGNOSIS — K5732 Diverticulitis of large intestine without perforation or abscess without bleeding: Secondary | ICD-10-CM | POA: Diagnosis not present

## 2022-02-11 LAB — CBC WITH DIFFERENTIAL/PLATELET
Abs Immature Granulocytes: 0.04 10*3/uL (ref 0.00–0.07)
Basophils Absolute: 0.1 10*3/uL (ref 0.0–0.1)
Basophils Relative: 1 %
Eosinophils Absolute: 0.2 10*3/uL (ref 0.0–0.5)
Eosinophils Relative: 3 %
HCT: 42.2 % (ref 36.0–46.0)
Hemoglobin: 14 g/dL (ref 12.0–15.0)
Immature Granulocytes: 0 %
Lymphocytes Relative: 17 %
Lymphs Abs: 1.5 10*3/uL (ref 0.7–4.0)
MCH: 30.8 pg (ref 26.0–34.0)
MCHC: 33.2 g/dL (ref 30.0–36.0)
MCV: 92.7 fL (ref 80.0–100.0)
Monocytes Absolute: 0.9 10*3/uL (ref 0.1–1.0)
Monocytes Relative: 10 %
Neutro Abs: 6.2 10*3/uL (ref 1.7–7.7)
Neutrophils Relative %: 69 %
Platelets: 299 10*3/uL (ref 150–400)
RBC: 4.55 MIL/uL (ref 3.87–5.11)
RDW: 13.4 % (ref 11.5–15.5)
WBC: 9 10*3/uL (ref 4.0–10.5)
nRBC: 0 % (ref 0.0–0.2)

## 2022-02-11 LAB — COMPREHENSIVE METABOLIC PANEL
ALT: 29 U/L (ref 0–44)
AST: 26 U/L (ref 15–41)
Albumin: 3.9 g/dL (ref 3.5–5.0)
Alkaline Phosphatase: 52 U/L (ref 38–126)
Anion gap: 10 (ref 5–15)
BUN: 16 mg/dL (ref 8–23)
CO2: 24 mmol/L (ref 22–32)
Calcium: 9.3 mg/dL (ref 8.9–10.3)
Chloride: 102 mmol/L (ref 98–111)
Creatinine, Ser: 1.02 mg/dL — ABNORMAL HIGH (ref 0.44–1.00)
GFR, Estimated: >60 mL/min (ref >60)
Glucose, Bld: 99 mg/dL (ref 70–99)
Potassium: 3.7 mmol/L (ref 3.5–5.1)
Sodium: 136 mmol/L (ref 135–145)
Total Bilirubin: 0.6 mg/dL (ref 0.3–1.2)
Total Protein: 7.7 g/dL (ref 6.5–8.1)

## 2022-02-11 LAB — LIPASE, BLOOD: Lipase: 37 U/L (ref 11–51)

## 2022-02-11 MED ORDER — AMOXICILLIN-POT CLAVULANATE 875-125 MG PO TABS: 1.0000 | ORAL_TABLET | Freq: Three times a day (TID) | ORAL | 0 refills | Status: AC

## 2022-02-11 MED ORDER — IOHEXOL 300 MG/ML  SOLN
100.0000 mL | Freq: Once | INTRAMUSCULAR | Status: AC | PRN
  Administered 2022-02-11: 100 mL via INTRAVENOUS

## 2022-02-11 NOTE — ED Notes (Signed)
Patient discharged at this time. Ambulated to lobby with independent and steady gait. Breathing unlabored speaking in full sentences. Verbalized understanding of all discharge, follow up, and medication teaching. Discharged homed with all belongings.   

## 2022-02-11 NOTE — ED Triage Notes (Signed)
Patient to ED for constipation x5 days. Patient states she has taken laxatives at home with no relief. Belching more than normal.

## 2022-02-11 NOTE — Discharge Instructions (Addendum)
Take Augmentin three times daily for the next five days.

## 2022-02-11 NOTE — ED Provider Notes (Signed)
[facility] Provider Note  Patient Contact: 4:24 PM (approximate)   History   Constipation   HPI  Allison Paul is a 62 y.o. female with a history of her disease, hypertension, sleep apnea and GERD, presents to the emergency department with diffuse abdominal discomfort and concern for possible constipation for the past 5 days.  Patient states that she has never had constipation to this degree in the past and her feeling members recommended she come into the emergency department with concern for obstruction.  No chest pain, chest tightness or shortness of breath.  No fever noted at home.      Physical Exam   Triage Vital Signs: ED Triage Vitals  Enc Vitals Group     BP 02/11/22 1544 (!) 143/87     Pulse Rate 02/11/22 1544 97     Resp 02/11/22 1544 19     Temp 02/11/22 1544 98.1 F (36.7 C)     Temp Source 02/11/22 1544 Oral     SpO2 02/11/22 1544 98 %     Weight 02/11/22 1545 293 lb (132.9 kg)     Height 02/11/22 1545 '5\' 3"'$  (1.6 m)     Head Circumference --      Peak Flow --      Pain Score 02/11/22 1553 5     Pain Loc --      Pain Edu? --      Excl. in [place]? --     Most recent vital signs: Vitals:   02/11/22 1544 02/11/22 1924  BP: (!) 143/87 138/74  Pulse: 97 84  Resp: 19 18  Temp: 98.1 F (36.7 C)   SpO2: 98% 99%     General: Alert and in no acute distress. Eyes:  PERRL. EOMI. Head: No acute traumatic findings ENT:      Nose: No congestion/rhinnorhea.      Mouth/Throat: Mucous membranes are moist.  Neck: No stridor. No cervical spine tenderness to palpation. Cardiovascular:  Good peripheral perfusion Respiratory: Normal respiratory effort without tachypnea or retractions. Lungs CTAB. Good air entry to the bases with no decreased or absent breath sounds. Gastrointestinal: Bowel sounds 4 quadrants. Soft and nontender to palpation. No guarding or rigidity. No palpable masses. No distention. No CVA tenderness. Musculoskeletal:  Full range of motion to all extremities.  Neurologic:  No gross focal neurologic deficits are appreciated.  Skin:   No rash noted    ED Results / Procedures / Treatments   Labs (all labs ordered are listed, but only abnormal results are displayed) Labs Reviewed  COMPREHENSIVE METABOLIC PANEL - Abnormal; Notable for the following components:      Result Value   Creatinine, Ser 1.02 (*)    All other components within normal limits  CBC WITH DIFFERENTIAL/PLATELET  LIPASE, BLOOD       RADIOLOGY  I personally viewed and evaluated these images as part of my medical decision making, as well as reviewing the written report by the radiologist.  ED Provider Interpretation: Mild acute uncomplicated diverticulitis   PROCEDURES:  Critical Care performed: No  Procedures   MEDICATIONS ORDERED IN ED: Medications  iohexol (OMNIPAQUE) 300 MG/ML solution 100 mL (100 mLs Intravenous Contrast Given 02/11/22 1820)     IMPRESSION / MDM / [place] / ED COURSE  I reviewed the triage vital signs and the nursing notes.  Assessment and plan:  Abdominal pain 62 year old female with unremarkable past medical history presents to the emergency department with nonspecific abdominal pain and constipation over the past 5 days.  CBC, CMP and lipase reassuring.  CT abdomen pelvis indicates acute uncomplicated diverticulitis.  Patient was started on Augmentin 3 times daily over the next 5 days.  Patient was cautioned to return to the emergency department if symptoms seem to be worsening at home.  She voiced understanding and has easy access to the emergency department.      FINAL CLINICAL IMPRESSION(S) / ED DIAGNOSES   Final diagnoses:  Diverticulitis     Rx / DC Orders   ED Discharge Orders          Ordered    amoxicillin-clavulanate (AUGMENTIN) 875-125 MG tablet  3 times daily        02/11/22 1909             Note:  This document was  prepared using Dragon voice recognition software and may include unintentional dictation errors.   Vallarie Mare Gage, Hershal Coria 02/11/22 Dallas Breeding, MD 02/11/22 2018

## 2022-02-13 ENCOUNTER — Encounter: Payer: Self-pay | Admitting: Emergency Medicine

## 2022-02-13 ENCOUNTER — Other Ambulatory Visit: Payer: Self-pay

## 2022-02-13 ENCOUNTER — Emergency Department
Admission: EM | Admit: 2022-02-13 | Discharge: 2022-02-13 | Disposition: A | Discharge status: Home / Self Care | Payer: Medicare HMO | Attending: Emergency Medicine | Admitting: Emergency Medicine

## 2022-02-13 DIAGNOSIS — K5900 Constipation, unspecified: Secondary | ICD-10-CM | POA: Diagnosis not present

## 2022-02-13 DIAGNOSIS — E039 Hypothyroidism, unspecified: Secondary | ICD-10-CM | POA: Diagnosis not present

## 2022-02-13 DIAGNOSIS — K5732 Diverticulitis of large intestine without perforation or abscess without bleeding: Secondary | ICD-10-CM | POA: Insufficient documentation

## 2022-02-13 DIAGNOSIS — I10 Essential (primary) hypertension: Secondary | ICD-10-CM | POA: Diagnosis not present

## 2022-02-13 LAB — CBC WITH DIFFERENTIAL/PLATELET
Abs Immature Granulocytes: 0.04 10*3/uL (ref 0.00–0.07)
Basophils Absolute: 0.1 10*3/uL (ref 0.0–0.1)
Basophils Relative: 1 %
Eosinophils Absolute: 0.1 10*3/uL (ref 0.0–0.5)
Eosinophils Relative: 1 %
HCT: 42.3 % (ref 36.0–46.0)
Hemoglobin: 14.2 g/dL (ref 12.0–15.0)
Immature Granulocytes: 1 %
Lymphocytes Relative: 17 %
Lymphs Abs: 1.3 10*3/uL (ref 0.7–4.0)
MCH: 30.7 pg (ref 26.0–34.0)
MCHC: 33.6 g/dL (ref 30.0–36.0)
MCV: 91.4 fL (ref 80.0–100.0)
Monocytes Absolute: 0.7 10*3/uL (ref 0.1–1.0)
Monocytes Relative: 9 %
Neutro Abs: 5.5 10*3/uL (ref 1.7–7.7)
Neutrophils Relative %: 71 %
Platelets: 308 10*3/uL (ref 150–400)
RBC: 4.63 MIL/uL (ref 3.87–5.11)
RDW: 13.2 % (ref 11.5–15.5)
WBC: 7.8 10*3/uL (ref 4.0–10.5)
nRBC: 0 % (ref 0.0–0.2)

## 2022-02-13 LAB — COMPREHENSIVE METABOLIC PANEL
ALT: 30 U/L (ref 0–44)
AST: 24 U/L (ref 15–41)
Albumin: 3.9 g/dL (ref 3.5–5.0)
Alkaline Phosphatase: 50 U/L (ref 38–126)
Anion gap: 9 (ref 5–15)
BUN: 11 mg/dL (ref 8–23)
CO2: 24 mmol/L (ref 22–32)
Calcium: 9.1 mg/dL (ref 8.9–10.3)
Chloride: 97 mmol/L — ABNORMAL LOW (ref 98–111)
Creatinine, Ser: 1.03 mg/dL — ABNORMAL HIGH (ref 0.44–1.00)
GFR, Estimated: >60 mL/min (ref >60)
Glucose, Bld: 95 mg/dL (ref 70–99)
Potassium: 3.6 mmol/L (ref 3.5–5.1)
Sodium: 130 mmol/L — ABNORMAL LOW (ref 135–145)
Total Bilirubin: 0.9 mg/dL (ref 0.3–1.2)
Total Protein: 7.9 g/dL (ref 6.5–8.1)

## 2022-02-13 LAB — LACTIC ACID, PLASMA: Lactic Acid, Venous: 1.3 mmol/L (ref 0.5–1.9)

## 2022-02-13 MED ORDER — SODIUM CHLORIDE 0.9 % IV BOLUS
1000.0000 mL | Freq: Once | INTRAVENOUS | Status: AC
  Administered 2022-02-13: 1000 mL via INTRAVENOUS

## 2022-02-13 NOTE — ED Triage Notes (Signed)
Pt states that she was seen on Wednesday night and given an antibiotic and miralax and reports that she still hasn't been able to have a bm, no distress noted, pt ambulatory to registration desk without difficulty

## 2022-02-13 NOTE — Discharge Instructions (Signed)
Continue to take MiraLAX 1 capful twice a day.  Continue to take your antibiotics as prescribed.  You develop any signs of complications from your diverticulitis-severe pain, no bowel movements at all and no gas from your rectum for 24 to 48 hours, profuse vomiting, fevers or chills, or any new or worsening symptoms-these can be signs of a blockage or worsening infection or perforation so you must come back to the emergency department right away.   Thank you for choosing [place] for your health care today!  Please see your primary doctor this week for a follow up appointment.   Sometimes, in the early stages of certain disease courses it is difficult to detect in the emergency department evaluation -- so, it is important that you continue to monitor your symptoms and call your doctor right away or return to the emergency department if you develop any new or worsening symptoms.  Please go to the following website to schedule new (and existing) patient appointments:   [email or web address]  If you do not have a primary doctor try calling the following clinics to establish care:  If you have insurance:  [facility] [phone] [place] [address]   [facility]  [phone] [place] [place] [address]   If you do not have insurance:  [facility]  [phone] [address] [place] [address]   The following is another list of primary care offices in the area who are accepting new patients at this time.  Please reach out to one of them directly and let them know you would like to schedule an appointment to follow up on an Emergency Department visit, and/or to establish a new primary care provider (PCP).  There are likely other primary care clinics in the are who are accepting new patients, but this is an excellent place to start:  [place] physician: Dr Lavon Paganini [address] [place], [place] [address] [phone]  [facility] Lead Physician: Dr Steele Sizer [address], [place], [place] [address] [phone]  [place] Physician: Dr Park Liter [address] [place], [place] [address] [phone]  [facility] Lead Physician: Dr Dewaine Oats [place], [place], [place] [address] [phone]  [place] [facility] [place] Physician: Dr Halina Maidens [address] [place],  [address] [phone]   It was my pleasure to care for you today.   Hoover Brunette Jacelyn Grip, MD

## 2022-02-13 NOTE — ED Provider Notes (Addendum)
[facility] Provider Note    Event Date/Time   First MD Initiated Contact with Patient 02/13/22 1137     (approximate)   History   Constipation   HPI  Allison Paul is a 62 y.o. female   Past medical history of hypertension, hypothyroid, and recently diagnosed mild uncomplicated sigmoid diverticulitis on 02/11/2022 discharged on Augmentin antibiotics and MiraLAX who presents to the emergency department because she has not been making a full bowel movement yet.  She has been passing gas and making small loose stools without blood or melena and has had no nausea or vomiting and the pain in her abdomen has much improved.  She had reports no fevers or chills.  She has no other acute medical complaints.  She has been taking MiraLAX once a day.  She has not been eating quite as much and eating soft foods and liquids only because she believes that this may help with her recently diagnosed diverticulitis.  No urinary sx.   External Medical Documents Reviewed: Emergency department visit note dated 02/11/2022 when she was diagnosed with mild uncomplicated sigmoid diverticulitis      Physical Exam   Triage Vital Signs: ED Triage Vitals  Enc Vitals Group     BP 02/13/22 1129 (!) 162/89     Pulse Rate 02/13/22 1129 85     Resp 02/13/22 1129 18     Temp 02/13/22 1129 97.7 F (36.5 C)     Temp Source 02/13/22 1129 Oral     SpO2 02/13/22 1129 96 %     Weight 02/13/22 1128 292 lb 15.9 oz (132.9 kg)     Height 02/13/22 1128 5' 3"$  (1.6 m)     Head Circumference --      Peak Flow --      Pain Score 02/13/22 1128 3     Pain Loc --      Pain Edu? --      Excl. in [place]? --     Most recent vital signs: Vitals:   02/13/22 1129  BP: (!) 162/89  Pulse: 85  Resp: 18  Temp: 97.7 F (36.5 C)  SpO2: 96%    General: Awake, no distress.  CV:  Good peripheral perfusion.  Resp:  Normal effort.  Abd:  No distention.  Other:  Soft nontender deep palpation all  quadrants without rigidity or guarding no fever hemodynamics otherwise appropriate reassuring aside from mild hypertension at 160/80   ED Results / Procedures / Treatments   Labs (all labs ordered are listed, but only abnormal results are displayed) Labs Reviewed  COMPREHENSIVE METABOLIC PANEL - Abnormal; Notable for the following components:      Result Value   Sodium 130 (*)    Chloride 97 (*)    Creatinine, Ser 1.03 (*)    All other components within normal limits  CBC WITH DIFFERENTIAL/PLATELET  LACTIC ACID, PLASMA     I ordered and reviewed the above labs they are notable for nl lactic acid, normal white blood cell count, creatinine within normal limits.  RADIOLOGY I independently reviewed and interpreted CT scan of the abdomen pelvis dated 02/11/2022 and see no obvious abscess, perforation, obstructive patterns   PROCEDURES:  Critical Care performed: No  Procedures   MEDICATIONS ORDERED IN ED: Medications  sodium chloride 0.9 % bolus 1,000 mL (1,000 mLs Intravenous New Bag/Given 02/13/22 1210)     IMPRESSION / MDM / [place] / ED COURSE  I reviewed the triage  vital signs and the nursing notes.                                Patient's presentation is most consistent with acute presentation with potential threat to life or bodily function.  Differential diagnosis includes, but is not limited to, complicated sigmoid diverticulitis including stricture, obstruction, abscess, perforation or uncomplicated constipation   The patient is on the cardiac monitor to evaluate for evidence of arrhythmia and/or significant heart rate changes.  MDM: She has had constipation leading up to her diagnosis of diverticulitis and she is passing flatus and small stools with poor p.o. intake and liquid intake mostly without any worsening of her abdominal pain or signs or symptoms of complications given no fever, no nausea or vomiting, improving pain.  Her chief concern is her  bowel movements.  We will check basic labs today CBC, CMP, lactic acid check lyte abn and kidney function - and give an IV crystalloid bolus given her poor p.o. intake over the last several days.  I advised her that given poor p.o. intake and resolving infection her bowel movements may not be as regular as they typically are, I advised continuing with MiraLAX and progression of diet as tolerated.  I advised her to return to the emergency department if she develops any signs of complications like obstruction or worsening infection including but not limited to severe pain, nausea vomiting, fever, or no flatus/bowel movements at all.  I considered hospitalization for admission or observation given overall improvement of symptoms since her last visit and constipation only, with a plan for increased bowel regimen and on home monitoring, I think that she can be discharged at this time with outpatient follow-up and monitoring and come back to the emergency department if any new or worsening symptoms.        FINAL CLINICAL IMPRESSION(S) / ED DIAGNOSES   Final diagnoses:  Sigmoid diverticulitis  Constipation, unspecified constipation type     Rx / DC Orders   ED Discharge Orders     None        Note:  This document was prepared using Dragon voice recognition software and may include unintentional dictation errors.    Lucillie Garfinkel, MD 02/13/22 [id number]    Lucillie Garfinkel, MD 02/13/22 [phone]

## 2022-02-20 ENCOUNTER — Other Ambulatory Visit: Payer: Self-pay

## 2022-02-23 ENCOUNTER — Ambulatory Visit (INDEPENDENT_AMBULATORY_CARE_PROVIDER_SITE_OTHER): Payer: Medicare HMO | Admitting: Gastroenterology

## 2022-02-23 ENCOUNTER — Encounter: Payer: Self-pay | Admitting: Gastroenterology

## 2022-02-23 VITALS — BP 131/85 | HR 75 | Temp 98.2°F | Ht 63.0 in | Wt 295.0 lb

## 2022-02-23 DIAGNOSIS — K5732 Diverticulitis of large intestine without perforation or abscess without bleeding: Secondary | ICD-10-CM

## 2022-02-23 NOTE — Progress Notes (Unsigned)
Cephas Darby, MD [address]  [place]  [place], [place] [address]  Main: [phone]  Fax: [phone]    Gastroenterology Consultation  Referring Provider:     Valera Castle, * Primary Care Physician:  Valera Castle, MD Primary Gastroenterologist:  Dr. Cephas Darby Reason for Consultation: Acute sigmoid diverticulitis        HPI:   Allison Paul is a 62 y.o. female referred by Dr. Kym Groom, Guy Begin, MD  for consultation & management of acute sigmoid diverticulitis.  Patient went to [facility] ER on 02/11/2022 secondary to severe constipation and she was taking laxatives at home with no relief.  She underwent CT abdomen pelvis with contrast which revealed mild uncomplicated sigmoid diverticulitis, mild hepatic steatosis and nonobstructing right renal calculus.  Labs including CBC, CMP, serum lipase were unremarkable.  She was given prescription for Augmentin.  She went to ER again on 2/9 because she was not having a bowel movement.  She was taking MiraLAX along with antibiotic.  Repeat labs were unremarkable.  Lactic acid was normal.  She was given MiraLAX bowel prep and finally able to have bowel movements.  She reports feeling significantly better, mild left lower quadrant discomfort only.  Reports having soft to loose bowel movements without any rectal bleeding.  She denies any abdominal bloating.  She does acknowledge eating sweets daily.  She does not drink carbonated beverages or sweet tea.  She does consume red meat few times a week.  NSAIDs: None  Antiplts/Anticoagulants/Anti thrombotics: None  GI Procedures:  Upper endoscopy and colonoscopy 05/06/2020  Mild Schatzki's ring, dilated Erosive gastropathy with no bleeding, no stigmata of recent bleeding, biopsies performed Normal duodenum  Hemorrhoids and 5 subcentimeter polyps in the colon Sigmoid colon diverticulosis Perianal skin tags   DIAGNOSIS: A. STOMACH; COLD BIOPSY: - ANTRAL AND  OXYNTIC MUCOSA WITH NO SIGNIFICANT PATHOLOGIC ALTERATION. - NEGATIVE FOR ACTIVE INFLAMMATION AND H PYLORI. - NEGATIVE FOR INTESTINAL METAPLASIA, DYSPLASIA, AND MALIGNANCY.  B. ESOPHAGUS; COLD BIOPSY: - SQUAMOUS MUCOSA WITH NO SIGNIFICANT PATHOLOGIC ALTERATION. - NEGATIVE FOR INCREASED EOSINOPHILS. - NEGATIVE FOR INTESTINAL METAPLASIA, DYSPLASIA, AND MALIGNANCY.  C. COLON POLYP X2, CECUM AND ASCENDING; COLD SNARE: - TUBULAR ADENOMA, MULTIPLE FRAGMENTS. - NEGATIVE FOR HIGH-GRADE DYSPLASIA AND MALIGNANCY.  D. COLON POLYP X3, DESCENDING AND SIGMOID; COLD SNARE: - TUBULAR ADENOMA, MULTIPLE FRAGMENTS. - NEGATIVE FOR HIGH-GRADE DYSPLASIA AND MALIGNANCY.   Past Medical History:  Diagnosis Date   Depressed    GERD (gastroesophageal reflux disease)    Hypertension    Hypothyroidism    Sleep apnea    Thyroid disease     Past Surgical History:  Procedure Laterality Date   arthoscopic knee     BIL   CERVICAL FUSION     april 2013   CHOLECYSTECTOMY     COLONOSCOPY WITH PROPOFOL N/A 05/06/2020   Procedure: COLONOSCOPY WITH PROPOFOL;  Surgeon: Virgel Manifold, MD;  Location: [facility] ENDOSCOPY;  Service: Endoscopy;  Laterality: N/A;   ESOPHAGOGASTRODUODENOSCOPY (EGD) WITH PROPOFOL N/A 05/06/2020   Procedure: ESOPHAGOGASTRODUODENOSCOPY (EGD) WITH PROPOFOL;  Surgeon: Virgel Manifold, MD;  Location: [facility] ENDOSCOPY;  Service: Endoscopy;  Laterality: N/A;   TUBAL LIGATION       Current Outpatient Medications:    Cyanocobalamin (VITAMIN B 12 PO), Take by mouth., Disp: , Rfl:    levothyroxine (SYNTHROID) 137 MCG tablet, Take 1 tablet by mouth daily., Disp: , Rfl:    metoprolol succinate (TOPROL-XL) 50 MG 24 hr tablet, Take  50 mg by mouth daily. Take with or immediately following a meal., Disp: , Rfl:    MIRALAX 17 GM/SCOOP powder, Take by mouth., Disp: , Rfl:    Multiple Vitamin (MULTI-VITAMIN) tablet, Take 1 tablet by mouth daily., Disp: , Rfl:    triamterene-hydrochlorothiazide (DYAZIDE)  37.5-25 MG capsule, Take 1 capsule by mouth daily., Disp: , Rfl:    VITAMIN B COMPLEX-C PO, Take by mouth., Disp: , Rfl:    ZINC CITRATE PO, Take by mouth., Disp: , Rfl:    No family history on file.   Social History   Tobacco Use   Smoking status: Former    Types: Cigarettes    Quit date: 12/06/2014    Years since quitting: 7.2   Smokeless tobacco: Never  Vaping Use   Vaping Use: Never used  Substance Use Topics   Alcohol use: Yes    Comment: Occassionally   Drug use: No    Allergies as of 02/23/2022 - Review Complete 02/23/2022  Allergen Reaction Noted   Prednisone  02/18/2020    Review of Systems:    All systems reviewed and negative except where noted in HPI.   Physical Exam:  BP 131/85 (BP Location: Left Arm, Patient Position: Sitting, Cuff Size: Large)   Pulse 75   Temp 98.2 F (36.8 C) (Oral)   Ht 5' 3"$  (1.6 m)   Wt 295 lb (133.8 kg)   BMI 52.26 kg/m  No LMP recorded. Patient is postmenopausal.  General:   Alert,  Well-developed, well-nourished, pleasant and cooperative in NAD Head:  Normocephalic and atraumatic. Eyes:  Sclera clear, no icterus.   Conjunctiva pink. Ears:  Normal auditory acuity. Nose:  No deformity, discharge, or lesions. Mouth:  No deformity or lesions,oropharynx pink & moist. Neck:  Supple; no masses or thyromegaly. Lungs:  Respirations even and unlabored.  Clear throughout to auscultation.   No wheezes, crackles, or rhonchi. No acute distress. Heart:  Regular rate and rhythm; no murmurs, clicks, rubs, or gallops. Abdomen:  Normal bowel sounds. Soft, non-tender and non-distended without masses, hepatosplenomegaly or hernias noted.  No guarding or rebound tenderness.   Rectal: Not performed Msk:  Symmetrical without gross deformities. Good, equal movement & strength bilaterally. Pulses:  Normal pulses noted. Extremities:  No clubbing or edema.  No cyanosis. Neurologic:  Alert and oriented x3;  grossly normal neurologically. Skin:   Intact without significant lesions or rashes. No jaundice. Psych:  Alert and cooperative. Normal mood and affect.  Imaging Studies: Reviewed  Assessment and Plan:   Allison Paul is a 62 y.o. female with morbid obesity, BMI 47, hypertension, hypothyroidism is seen in consultation for left lower quadrant discomfort, history of constipation.  CT revealed mild sigmoid diverticulitis, treated with Augmentin  History of sigmoid diverticulitis, uncomplicated Advised patient regarding healthy lifestyle, healthy eating habits, significantly cut back on processed foods and meats including red meat Discussed about physical activity Discussed about management of constipation  History of tubular adenomas of the colon Recommend surveillance colonoscopy in 05/2023   Follow up as needed   Cephas Darby, MD

## 2022-05-13 ENCOUNTER — Ambulatory Visit
Admission: RE | Admit: 2022-05-13 | Discharge: 2022-05-13 | Disposition: A | Discharge status: Home / Self Care | Payer: Medicare HMO | Source: Ambulatory Visit | Attending: Internal Medicine | Admitting: Internal Medicine

## 2022-05-13 ENCOUNTER — Other Ambulatory Visit: Payer: Self-pay | Admitting: Internal Medicine

## 2022-05-13 DIAGNOSIS — I10 Essential (primary) hypertension: Secondary | ICD-10-CM | POA: Insufficient documentation

## 2022-05-13 DIAGNOSIS — R0602 Shortness of breath: Secondary | ICD-10-CM | POA: Insufficient documentation

## 2022-11-12 IMAGING — CT CT HEAD W/O CM
4 series · 16 of 47 positions shown, 18 images · non-contrast
Comparison: Head CT dated 01/31/2009.

CLINICAL DATA: Trauma.



[Series 2: head wo · axial · 0.43mm/px · z∈[-46,+64]mm · 7 of 30 slices shown, 9 images]
[im 4/30  brain]
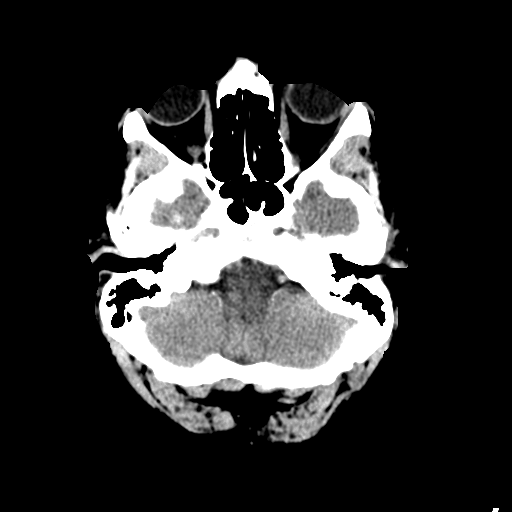
[im 4/30  bone]
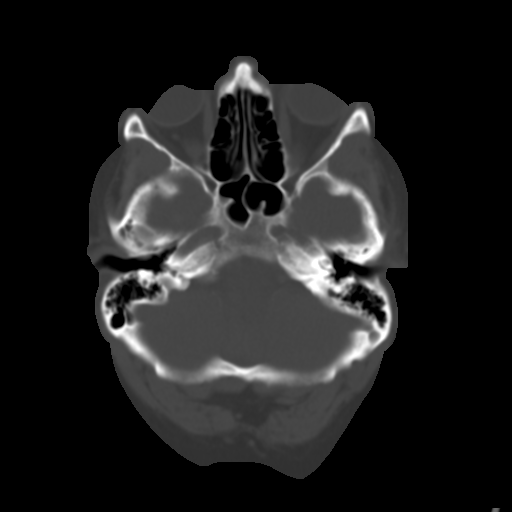
[im 8/30  brain]
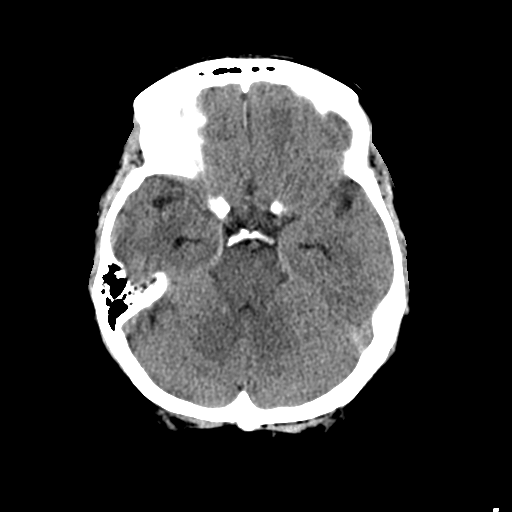
[im 11/30  brain]
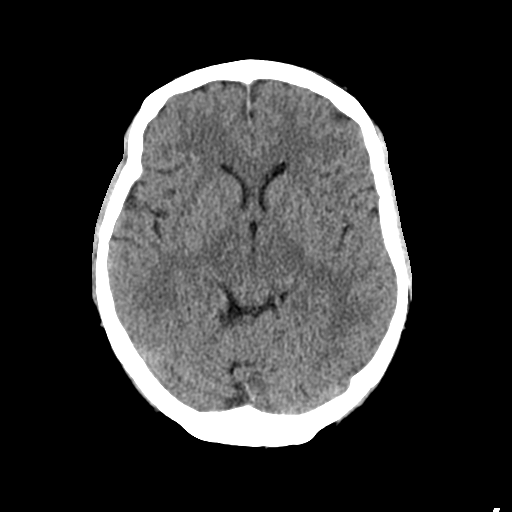
[im 15/30  brain]
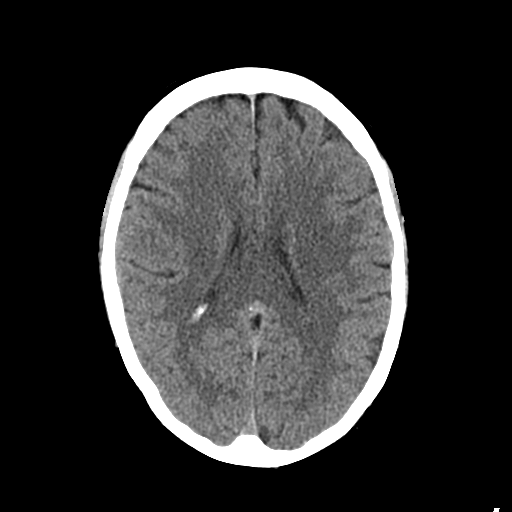
[im 19/30  brain]
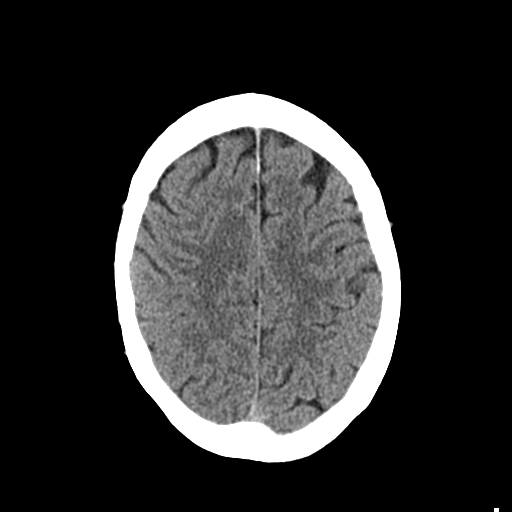
[im 19/30  bone]
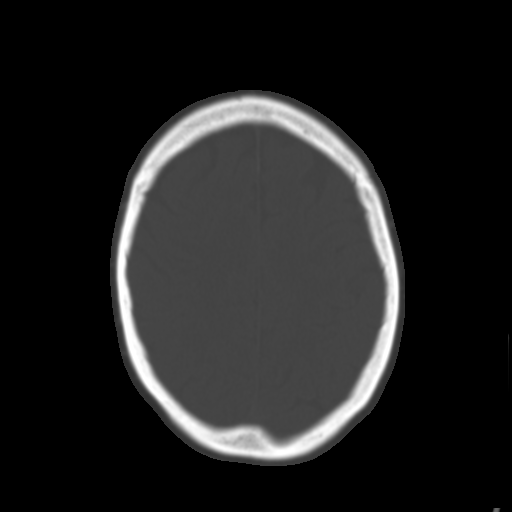
[im 22/30  brain]
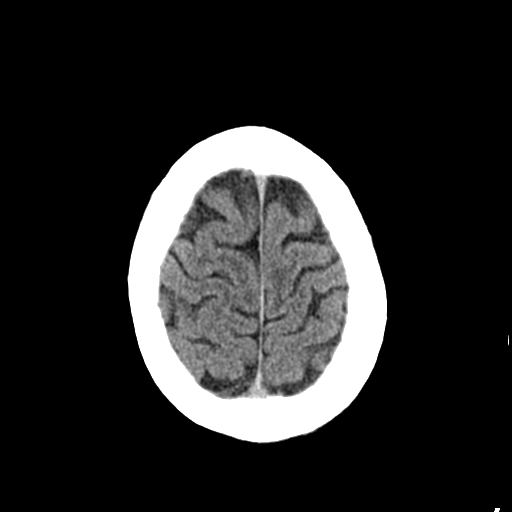
[im 26/30  brain]
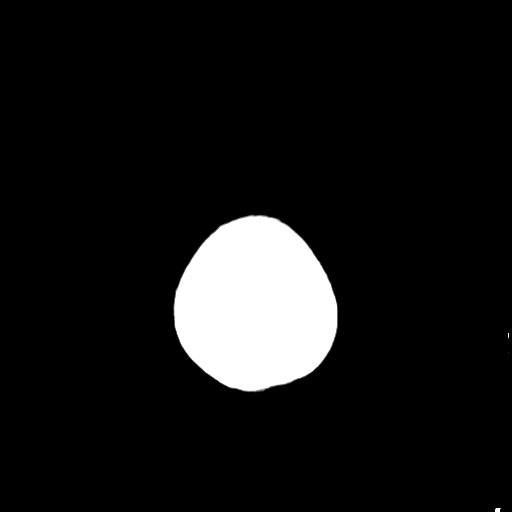

[Series 3: head bone · axial · 0.43mm/px · z∈[-47,-19]mm · 3 of 74 slices shown]
[im 8/74  bone]
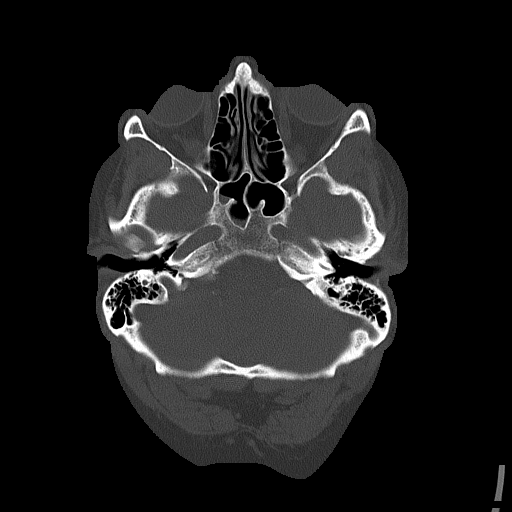
[im 15/74  bone]
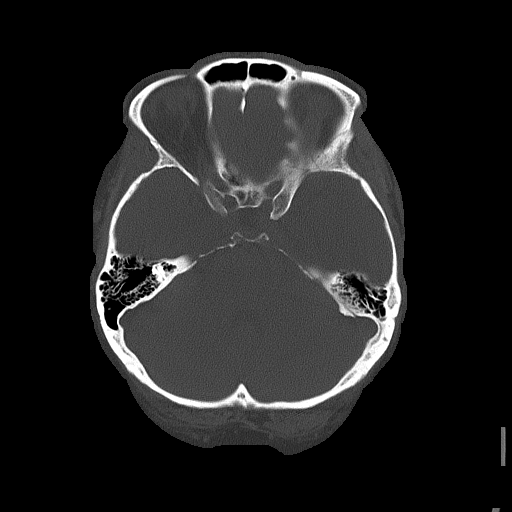
[im 22/74  bone]
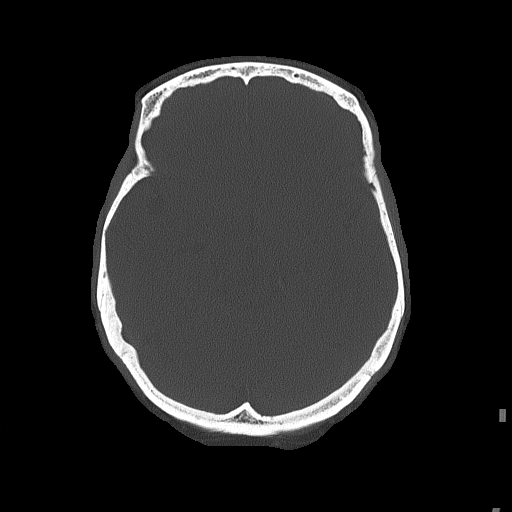

[Series 4: coronal soft tissue · coronal · 0.29mm/px · 3 of 64 slices shown]
[im 22/64  brain]
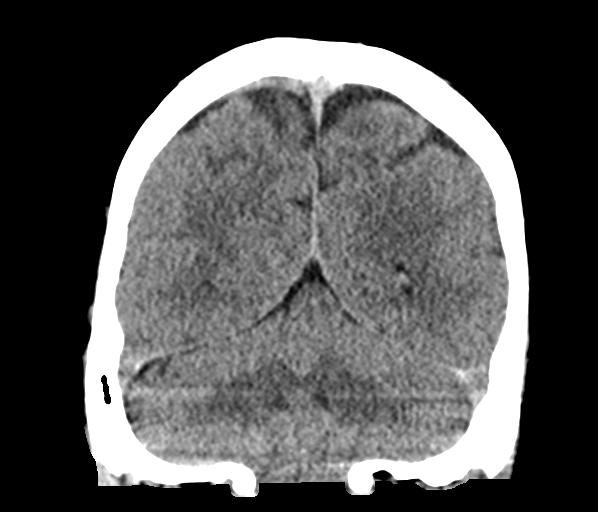
[im 29/64  brain]
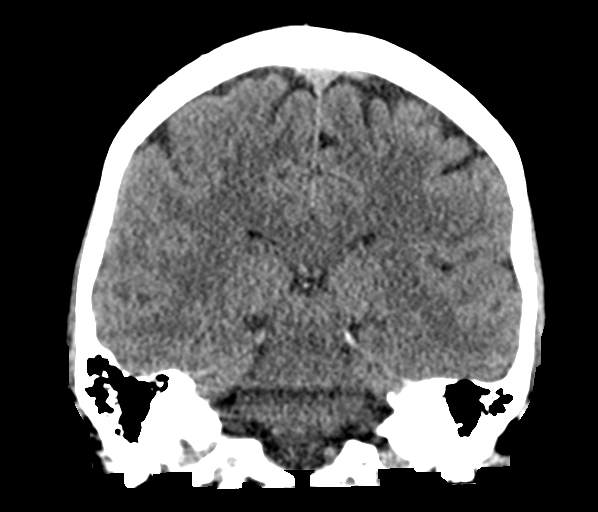
[im 36/64  brain]
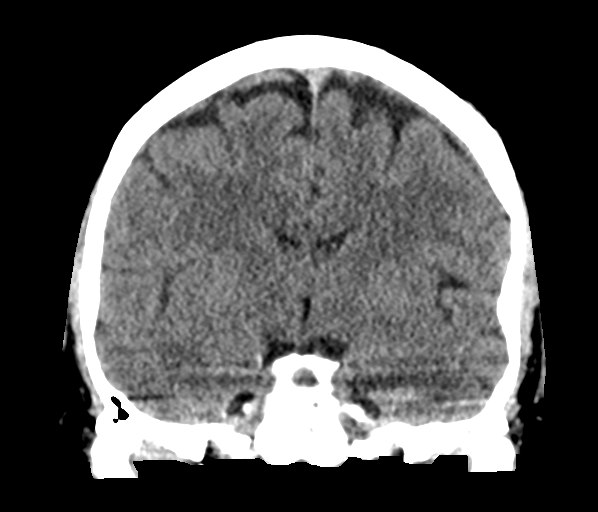

[Series 5: sagittal soft tissue · sagittal · 0.29mm/px · 3 of 58 slices shown]
[im 20/58  brain]
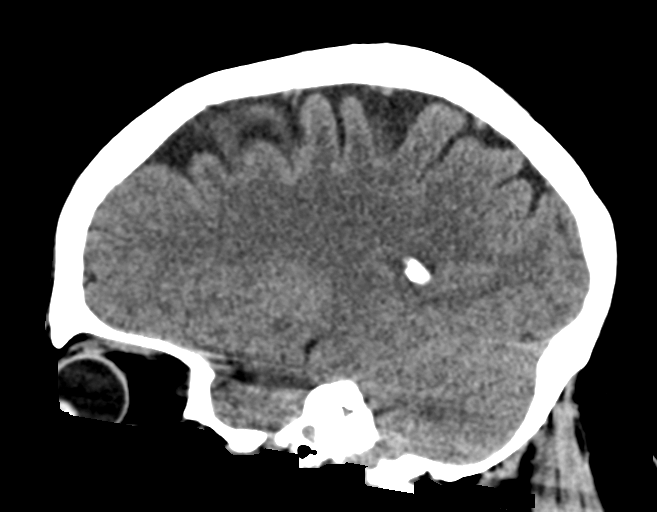
[im 29/58  brain]
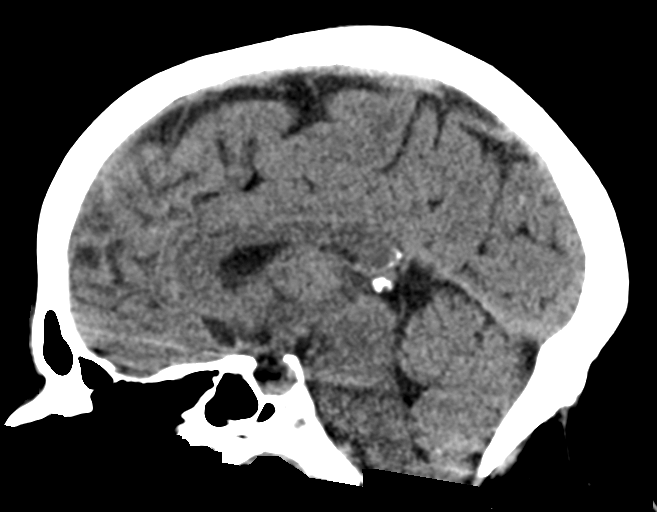
[im 39/58  brain]
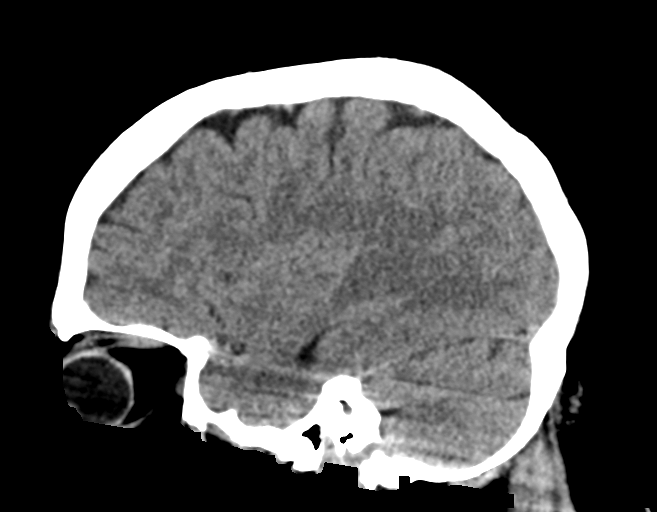

[16 of 47 positions shown; findings below may reference images not displayed]

FINDINGS: Brain: The ventricles and sulci are appropriate size for the
patient's age. The gray-white matter discrimination is preserved.
There is no acute intracranial hemorrhage. No mass effect or midline
shift. No extra-axial fluid collection.

Vascular: No hyperdense vessel or unexpected calcification.

Skull: Normal. Negative for fracture or focal lesion.

Sinuses/Orbits: No acute finding.

Other: None
IMPRESSION: Unremarkable noncontrast CT of the brain.

## 2022-11-12 IMAGING — CT CT CERVICAL SPINE W/O CM
3 of 5 series · 11 of 35 positions shown, 13 images · non-contrast
Comparison: None.

CLINICAL DATA: Trauma.



[Series 6: sagittal bone · sagittal · 0.23mm/px · 5 of 67 slices shown, 6 images]
[im 23/67  bone]
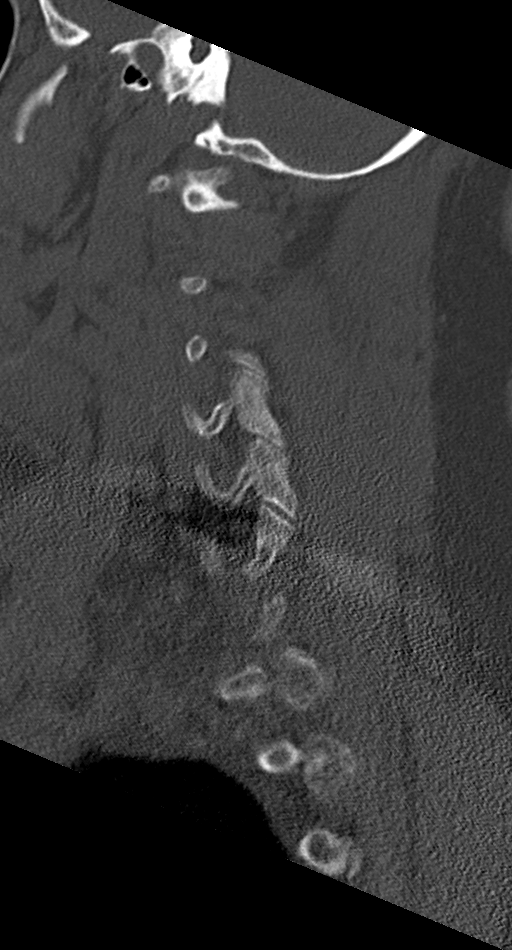
[im 28/67  bone]
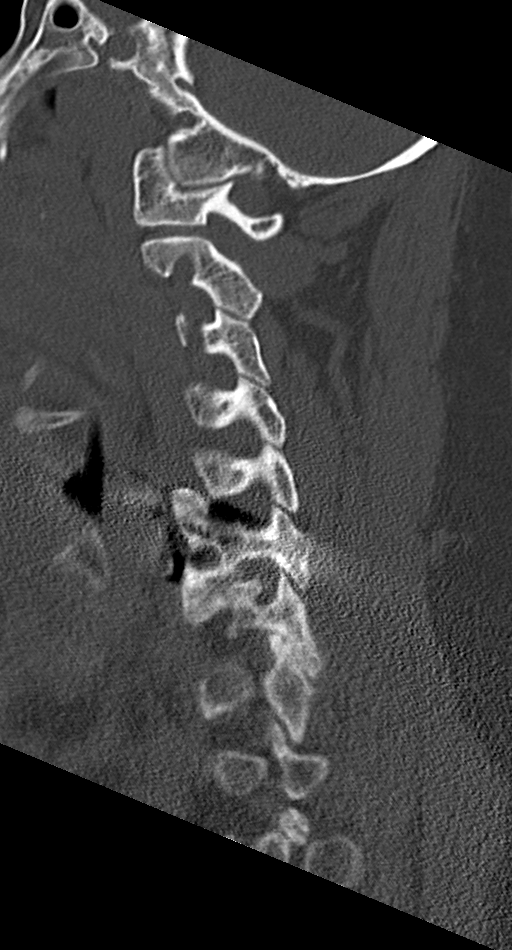
[im 34/67  soft-tissue]
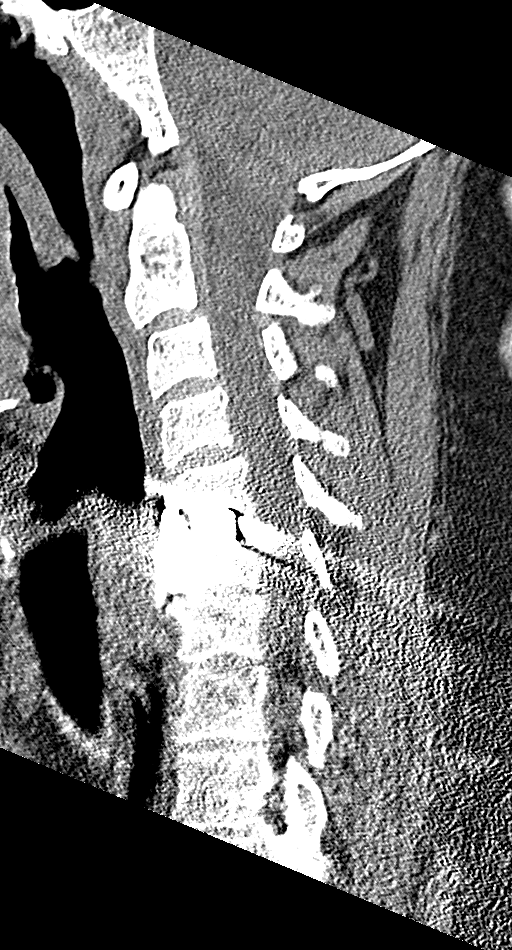
[im 34/67  bone]
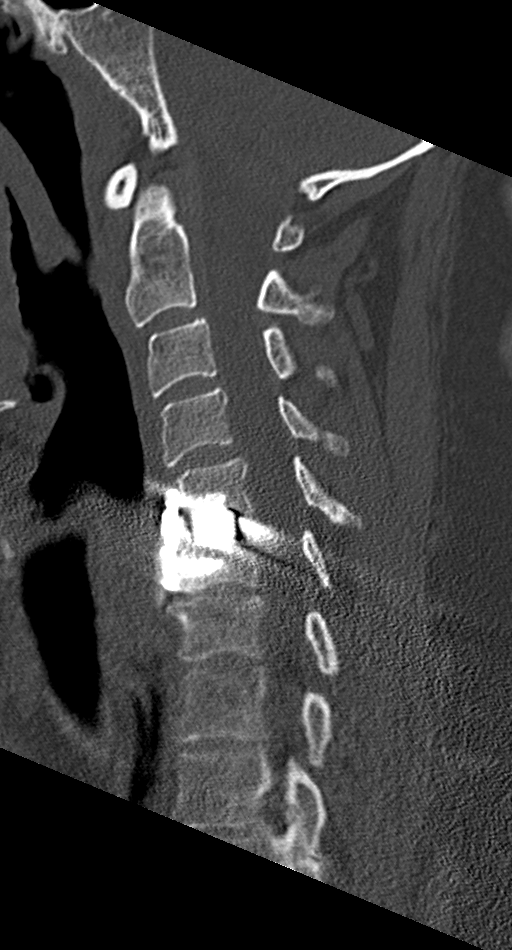
[im 39/67  bone]
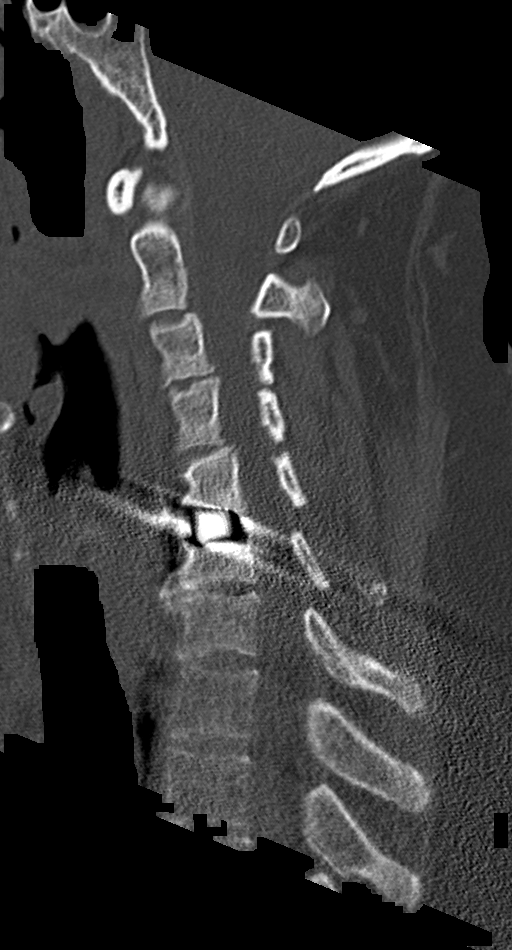
[im 45/67  bone]
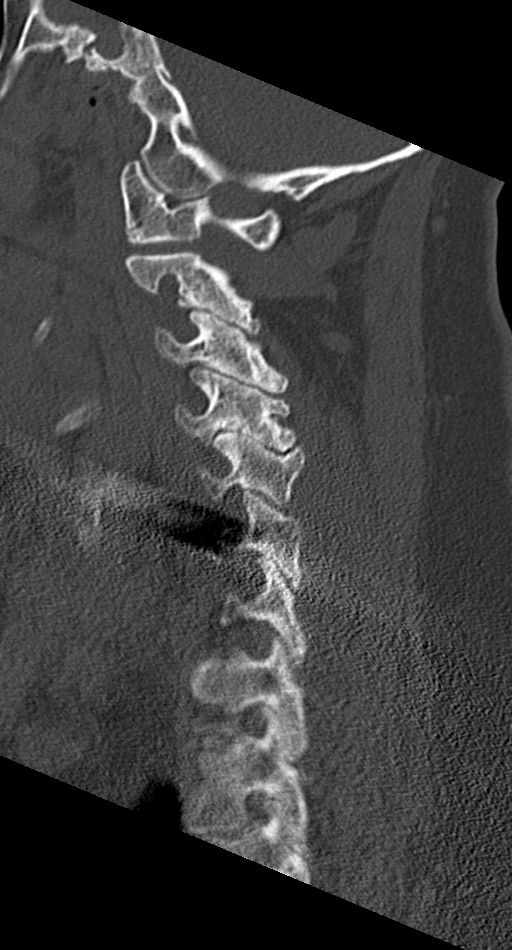

[Series 7: coronal bone · coronal · 0.26mm/px · 3 of 60 slices shown]
[im 12/60  bone]
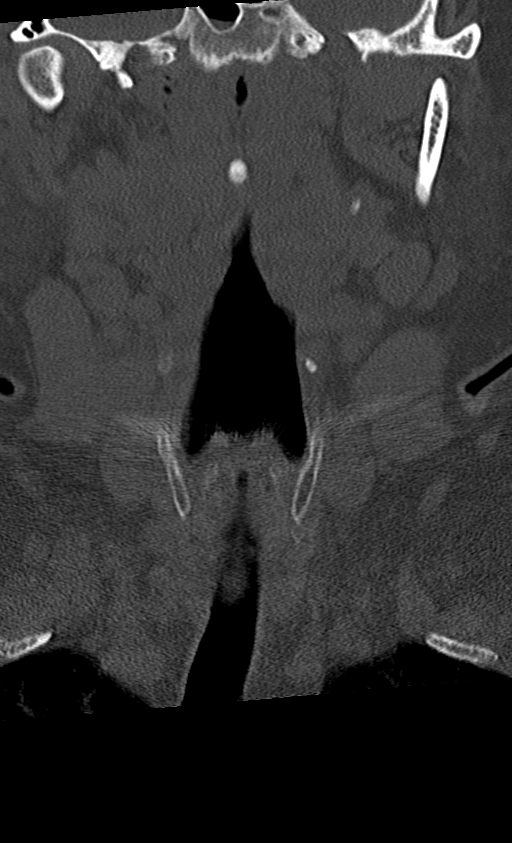
[im 24/60  bone]
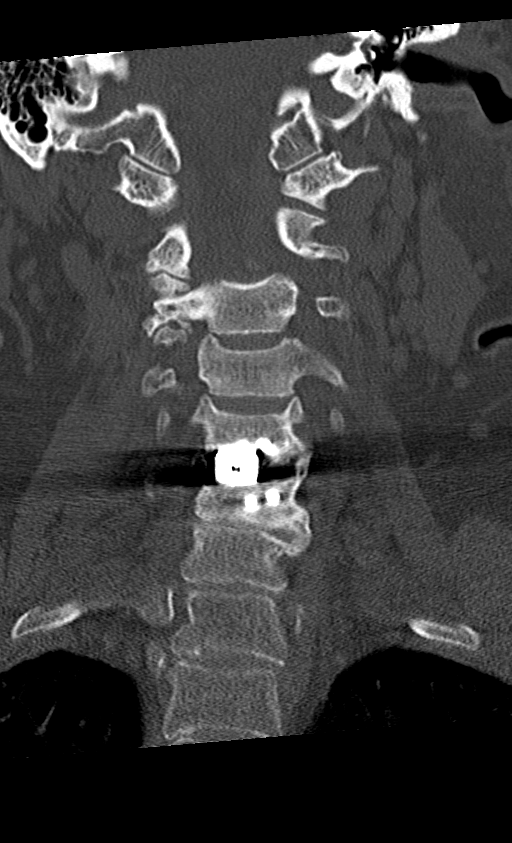
[im 36/60  bone]
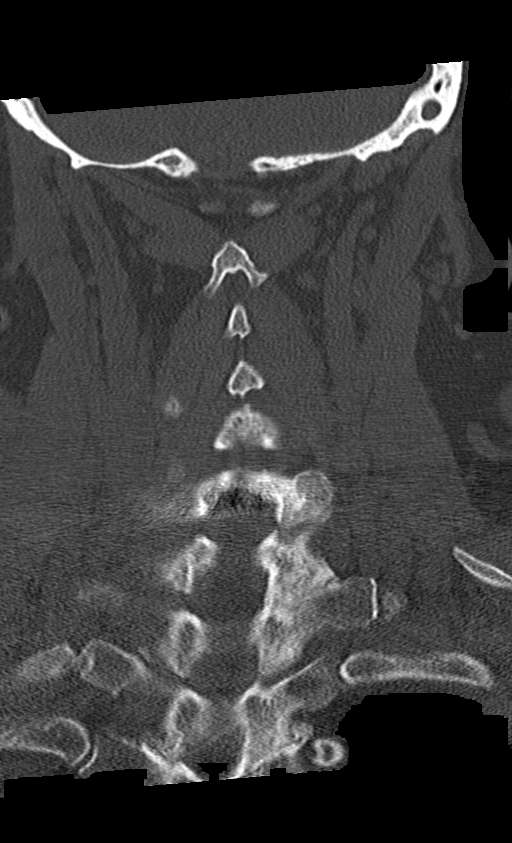

[Series 8: orthogonal bone · axial · 0.23mm/px · z∈[-224,-103]mm · 3 of 110 slices shown, 4 images]
[im 22/110  soft-tissue]
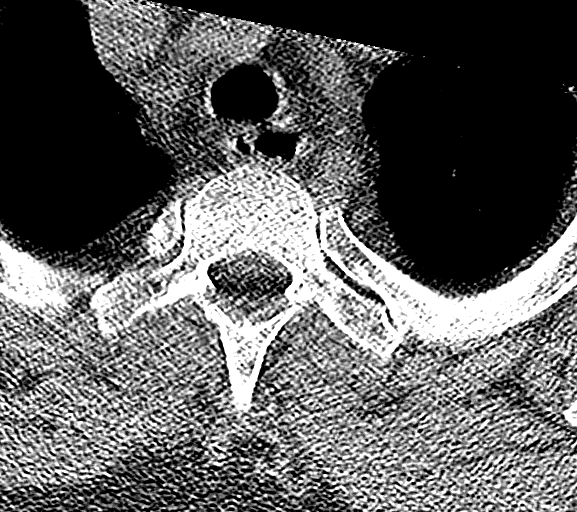
[im 22/110  bone]
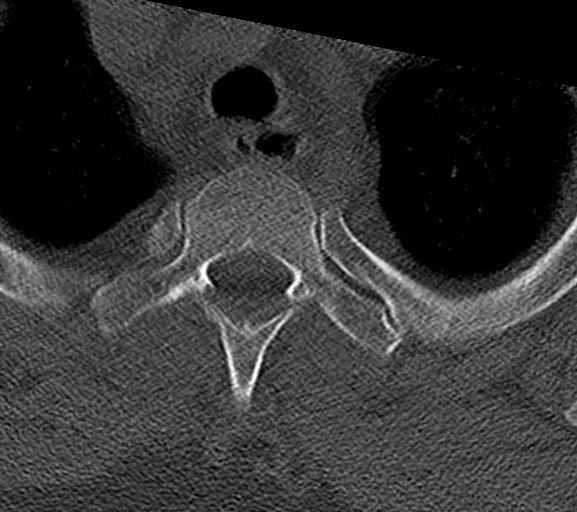
[im 66/110  bone]
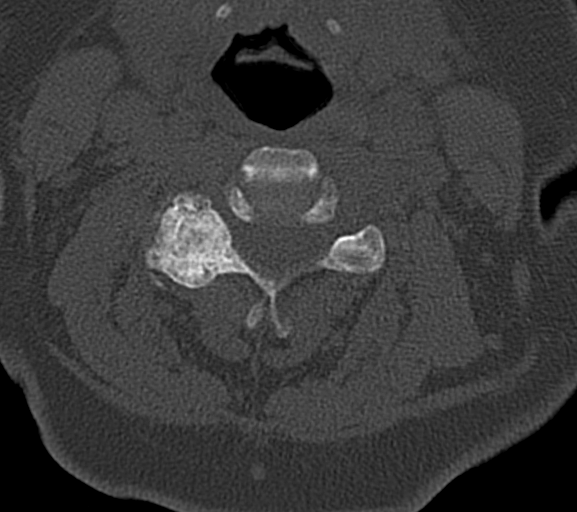
[im 88/110  bone]
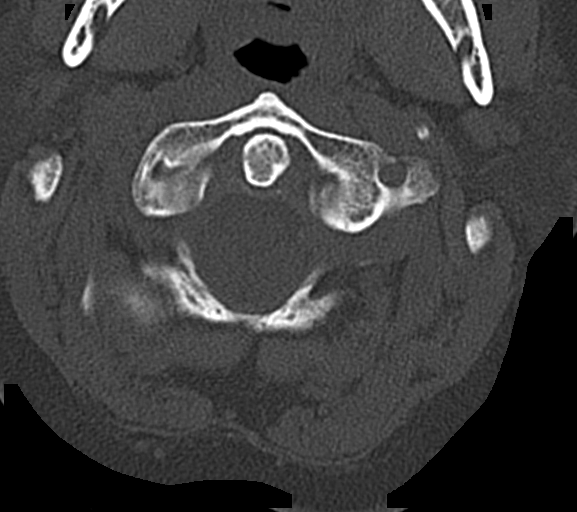

[11 of 35 positions shown; findings below may reference images not displayed]

FINDINGS: Alignment: No acute subluxation. There is straightening of normal
cervical lordosis.

Skull base and vertebrae: No acute fracture.

Soft tissues and spinal canal: No prevertebral fluid or swelling. No
visible canal hematoma.

Disc levels:  C5-C6 disc spacer and anterior fusion.

Upper chest: Negative.

Other: None
IMPRESSION: 1. No acute fracture or subluxation of the cervical spine.
2. C5-C6 disc spacer and anterior fusion.
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
## 2023-01-20 ENCOUNTER — Emergency Department
Admission: EM | Admit: 2023-01-20 | Discharge: 2023-01-20 | Disposition: A | Discharge status: Home / Self Care | Payer: Medicare HMO | Attending: Emergency Medicine | Admitting: Emergency Medicine

## 2023-01-20 ENCOUNTER — Other Ambulatory Visit: Payer: Self-pay

## 2023-01-20 ENCOUNTER — Emergency Department: Payer: Medicare HMO

## 2023-01-20 DIAGNOSIS — I10 Essential (primary) hypertension: Secondary | ICD-10-CM | POA: Diagnosis not present

## 2023-01-20 DIAGNOSIS — K5792 Diverticulitis of intestine, part unspecified, without perforation or abscess without bleeding: Secondary | ICD-10-CM | POA: Diagnosis not present

## 2023-01-20 DIAGNOSIS — R1011 Right upper quadrant pain: Secondary | ICD-10-CM | POA: Diagnosis present

## 2023-01-20 LAB — CBC
HCT: 44.1 % (ref 36.0–46.0)
Hemoglobin: 14.8 g/dL (ref 12.0–15.0)
MCH: 31 pg (ref 26.0–34.0)
MCHC: 33.6 g/dL (ref 30.0–36.0)
MCV: 92.5 fL (ref 80.0–100.0)
Platelets: 273 10*3/uL (ref 150–400)
RBC: 4.77 MIL/uL (ref 3.87–5.11)
RDW: 13.2 % (ref 11.5–15.5)
WBC: 9.8 10*3/uL (ref 4.0–10.5)
nRBC: 0 % (ref 0.0–0.2)

## 2023-01-20 LAB — COMPREHENSIVE METABOLIC PANEL
ALT: 25 U/L (ref 0–44)
AST: 23 U/L (ref 15–41)
Albumin: 4.1 g/dL (ref 3.5–5.0)
Alkaline Phosphatase: 51 U/L (ref 38–126)
Anion gap: 14 (ref 5–15)
BUN: 14 mg/dL (ref 8–23)
CO2: 22 mmol/L (ref 22–32)
Calcium: 9.3 mg/dL (ref 8.9–10.3)
Chloride: 100 mmol/L (ref 98–111)
Creatinine, Ser: 1.02 mg/dL — ABNORMAL HIGH (ref 0.44–1.00)
GFR, Estimated: >60 mL/min (ref >60)
Glucose, Bld: 102 mg/dL — ABNORMAL HIGH (ref 70–99)
Potassium: 3.7 mmol/L (ref 3.5–5.1)
Sodium: 136 mmol/L (ref 135–145)
Total Bilirubin: 1 mg/dL (ref 0.0–1.2)
Total Protein: 7.7 g/dL (ref 6.5–8.1)

## 2023-01-20 LAB — URINALYSIS, ROUTINE W REFLEX MICROSCOPIC
Bilirubin Urine: NEGATIVE
Glucose, UA: NEGATIVE mg/dL
Hgb urine dipstick: NEGATIVE
Ketones, ur: NEGATIVE mg/dL
Nitrite: NEGATIVE
Protein, ur: NEGATIVE mg/dL
Specific Gravity, Urine: 1.011 (ref 1.005–1.030)
pH: 7 (ref 5.0–8.0)

## 2023-01-20 LAB — LIPASE, BLOOD: Lipase: 32 U/L (ref 11–51)

## 2023-01-20 MED ORDER — IOHEXOL 300 MG/ML  SOLN
100.0000 mL | Freq: Once | INTRAMUSCULAR | Status: AC | PRN
  Administered 2023-01-20: 100 mL via INTRAVENOUS

## 2023-01-20 NOTE — ED Triage Notes (Signed)
 Pt c/o right upper abdominal pain for a few weeks now with nausea. Pt denies any diarrhea. Pt was recently put on an antibiotic for possible diverticulitis.  Pt reports hx of hypertension and diverticulitis, has also had her gallbladder removed

## 2023-01-20 NOTE — ED Notes (Signed)
 Patient transported to CT

## 2023-01-20 NOTE — ED Notes (Signed)
 Pt verbalized understanding of discharge instructions. Opportunity for questions provided.

## 2023-01-20 NOTE — ED Provider Notes (Signed)
 [facility] Provider Note    Event Date/Time   First MD Initiated Contact with Patient 01/20/23 1140     (approximate)   History   Chief Complaint: Abdominal Pain   HPI  Allison Paul is a 63 y.o. female with a history of hypertension, GERD who comes to the ED complaining of right upper quadrant pain for the past 3 weeks.  Associated nausea, no vomiting constipation or diarrhea.  No chest pain or shortness of breath.  Not exertional, not pleuritic.  Eating okay.    Outside records reviewed noting patient saw her PCP 2 days ago, was started on Augmentin  for possible diverticulitis, was ordered for a CT abdomen pelvis          Physical Exam   Triage Vital Signs: ED Triage Vitals  Encounter Vitals Group     BP 01/20/23 0948 (!) 140/95     Systolic BP Percentile --      Diastolic BP Percentile --      Pulse Rate 01/20/23 0948 90     Resp 01/20/23 0948 16     Temp 01/20/23 0948 97.7 F (36.5 C)     Temp Source 01/20/23 0948 Oral     SpO2 01/20/23 0948 100 %     Weight 01/20/23 0949 288 lb (130.6 kg)     Height 01/20/23 0949 5\' 3"  (1.6 m)     Head Circumference --      Peak Flow --      Pain Score 01/20/23 0949 7     Pain Loc --      Pain Education --      Exclude from Growth Chart --     Most recent vital signs: Vitals:   01/20/23 1512 01/20/23 1513  BP:    Pulse: 71   Resp: 16   Temp:  98.3 F (36.8 C)  SpO2: 97%     General: Awake, no distress.  CV:  Good peripheral perfusion.  Regular rate rhythm Resp:  Normal effort.  Good auscultation bilaterally Abd:  No distention.  Tenderness in the right upper quadrant.  No signs of peritonitis Other:  No lower extremity edema or calf tenderness   ED Results / Procedures / Treatments   Labs (all labs ordered are listed, but only abnormal results are displayed) Labs Reviewed  COMPREHENSIVE METABOLIC PANEL - Abnormal; Notable for the following components:      Result Value    Glucose, Bld 102 (*)    Creatinine, Ser 1.02 (*)    All other components within normal limits  URINALYSIS, ROUTINE W REFLEX MICROSCOPIC - Abnormal; Notable for the following components:   Color, Urine YELLOW (*)    APPearance HAZY (*)    Leukocytes,Ua SMALL (*)    Bacteria, UA RARE (*)    All other components within normal limits  LIPASE, BLOOD  CBC     EKG    RADIOLOGY CT abdomen pelvis interpreted by me, negative for free air or bowel obstruction.  Radiology report reviewed noting uncomplicated diverticulitis   PROCEDURES:  Procedures   MEDICATIONS ORDERED IN ED: Medications  iohexol  (OMNIPAQUE ) 300 MG/ML solution 100 mL (100 mLs Intravenous Contrast Given 01/20/23 1243)     IMPRESSION / MDM / ASSESSMENT AND PLAN / ED COURSE  I reviewed the triage vital signs and the nursing notes.  DDx: Pancreatitis, gastritis, choledocholithiasis, diverticulitis, constipation, partial bowel obstruction  Patient's presentation is most consistent with acute presentation with potential threat to life  or bodily function.  Patient presents with right upper quadrant abdominal pain with tenderness.  She is status post cholecystectomy but may have developed a spontaneous choledocholithiasis.  Labs today are reassuring.  Will obtain CT abdomen pelvis   ----------------------------------------- 3:25 PM on 01/20/2023 ----------------------------------------- Feels better.  Discussed results with the patient, stable for discharge home.  She declines prescription for analgesia or antiemetic.  Has Augmentin .      FINAL CLINICAL IMPRESSION(S) / ED DIAGNOSES   Final diagnoses:  Diverticulitis     Rx / DC Orders   ED Discharge Orders     None        Note:  This document was prepared using Dragon voice recognition software and may include unintentional dictation errors.   Jacquie Maudlin, MD 01/20/23 1525

## 2023-02-12 ENCOUNTER — Other Ambulatory Visit: Payer: Self-pay | Admitting: Family Medicine

## 2023-02-12 DIAGNOSIS — R1011 Right upper quadrant pain: Secondary | ICD-10-CM

## 2023-04-21 ENCOUNTER — Emergency Department
Admission: EM | Admit: 2023-04-21 | Discharge: 2023-04-21 | Disposition: A | Discharge status: Home / Self Care | Attending: Emergency Medicine | Admitting: Emergency Medicine

## 2023-04-21 ENCOUNTER — Emergency Department

## 2023-04-21 DIAGNOSIS — R202 Paresthesia of skin: Secondary | ICD-10-CM

## 2023-04-21 DIAGNOSIS — I10 Essential (primary) hypertension: Secondary | ICD-10-CM | POA: Diagnosis not present

## 2023-04-21 DIAGNOSIS — R42 Dizziness and giddiness: Secondary | ICD-10-CM

## 2023-04-21 LAB — URINALYSIS, ROUTINE W REFLEX MICROSCOPIC
Bilirubin Urine: NEGATIVE
Glucose, UA: NEGATIVE mg/dL
Hgb urine dipstick: NEGATIVE
Ketones, ur: NEGATIVE mg/dL
Leukocytes,Ua: NEGATIVE
Nitrite: NEGATIVE
Protein, ur: NEGATIVE mg/dL
Specific Gravity, Urine: 1.006 (ref 1.005–1.030)
pH: 6 (ref 5.0–8.0)

## 2023-04-21 LAB — CBC
HCT: 42.8 % (ref 36.0–46.0)
Hemoglobin: 14.3 g/dL (ref 12.0–15.0)
MCH: 30.6 pg (ref 26.0–34.0)
MCHC: 33.4 g/dL (ref 30.0–36.0)
MCV: 91.6 fL (ref 80.0–100.0)
Platelets: 259 10*3/uL (ref 150–400)
RBC: 4.67 MIL/uL (ref 3.87–5.11)
RDW: 13.4 % (ref 11.5–15.5)
WBC: 6.7 10*3/uL (ref 4.0–10.5)
nRBC: 0 % (ref 0.0–0.2)

## 2023-04-21 LAB — BASIC METABOLIC PANEL WITH GFR
Anion gap: 9 (ref 5–15)
BUN: 19 mg/dL (ref 8–23)
CO2: 22 mmol/L (ref 22–32)
Calcium: 9.3 mg/dL (ref 8.9–10.3)
Chloride: 105 mmol/L (ref 98–111)
Creatinine, Ser: 0.92 mg/dL (ref 0.44–1.00)
GFR, Estimated: >60 mL/min (ref >60)
Glucose, Bld: 85 mg/dL (ref 70–99)
Potassium: 3.7 mmol/L (ref 3.5–5.1)
Sodium: 136 mmol/L (ref 135–145)

## 2023-04-21 MED ORDER — GADOBUTROL 1 MMOL/ML IV SOLN
10.0000 mL | Freq: Once | INTRAVENOUS | Status: AC | PRN
  Administered 2023-04-21: 10 mL via INTRAVENOUS

## 2023-04-21 MED ORDER — DIAZEPAM 2 MG PO TABS: 2.0000 mg | ORAL_TABLET | Freq: Four times a day (QID) | ORAL | 0 refills | Status: AC | PRN

## 2023-04-21 MED ORDER — DIAZEPAM 5 MG/ML IJ SOLN
2.5000 mg | Freq: Once | INTRAMUSCULAR | Status: AC
  Administered 2023-04-21: 2.5 mg via INTRAVENOUS
  Filled 2023-04-21: qty 2

## 2023-04-21 MED ORDER — DIAZEPAM 2 MG PO TABS
2.0000 mg | ORAL_TABLET | Freq: Once | ORAL | Status: AC
  Administered 2023-04-21: 2 mg via ORAL
  Filled 2023-04-21: qty 1

## 2023-04-21 NOTE — ED Triage Notes (Signed)
 See First Nurse note.   Pt reports intermittent dizziness and lower face numbness x2 weeks.  Pt has been prescribed meclizine, but has not been taking it as prescribed.  Pt sts no relief when she has taken it.  Pt is able to speak w/o difficulty.    Pt sts she is seen by a chiropractor, but sts "it isn't helping."

## 2023-04-21 NOTE — ED Triage Notes (Signed)
 Dizziness and facial numbness x 2 weeks.  Numbness intermittently. This morning numbness worse and not going away.  Has been given meclizine, but not taking as prescribed.   VS wnl.

## 2023-04-21 NOTE — ED Provider Notes (Signed)
 [facility] Provider Note    Event Date/Time   First MD Initiated Contact with Patient 04/21/23 1243     (approximate)   History   Dizziness and Numbness   HPI Allison Paul is a 63 y.o. female with history of HTN, depression, anxiety presenting today for dizziness.  Patient states over the past 2 weeks she has had intermittent dizziness symptoms.  She was prescribed meclizine has been taking it without relief.  She also notes some numbness to the lower part of her face bilaterally.  No acute one-sided numbness or weakness to her extremities.  No facial droop.  No speech changes.  No vision changes.  Occasionally feels some ringing in her left ear but otherwise no hearing loss or ear pain.  No prior history of stroke.     Physical Exam   Triage Vital Signs: ED Triage Vitals [04/21/23 1230]  Encounter Vitals Group     BP (!) 142/91     Systolic BP Percentile      Diastolic BP Percentile      Pulse Rate 67     Resp 18     Temp 97.7 F (36.5 C)     Temp Source Oral     SpO2 92 %     Weight 290 lb (131.5 kg)     Height 5\' 3"  (1.6 m)     Head Circumference      Peak Flow      Pain Score 0     Pain Loc      Pain Education      Exclude from Growth Chart     Most recent vital signs: Vitals:   04/21/23 1230  BP: (!) 142/91  Pulse: 67  Resp: 18  Temp: 97.7 F (36.5 C)  SpO2: 92%   I have reviewed the vital signs. General:  Awake, alert, no acute distress. Head:  Normocephalic, Atraumatic. EENT:  PERRL, EOMI, Oral mucosa pink and moist, Neck is supple. Cardiovascular: Regular rate, 2+ distal pulses. Respiratory:  Normal respiratory effort, symmetrical expansion, no distress.   Extremities:  Moving all four extremities through full ROM without pain.   Neuro:  Alert and oriented.  Interacting appropriately.  She feels a sensation change to the lower part of her face bilaterally.  1 side does not feel different from the other.  Otherwise  sensation throughout her face normal.  Cranial nerves II through XII otherwise intact.  Sensation equal and intact throughout bilateral upper and lower extremities.  Strength 5 out of 5 and intact throughout bilateral upper and lower extremities.  No ataxia when walking.  No dysmetria.  With Dix-Hallpike, does not have extensive nystagmus on exam but does feel some change with movement in her dizziness symptoms.  Otherwise notdizziness at rest. Skin:  Warm, dry, no rash.   Psych: Appropriate affect.    ED Results / Procedures / Treatments   Labs (all labs ordered are listed, but only abnormal results are displayed) Labs Reviewed  URINALYSIS, ROUTINE W REFLEX MICROSCOPIC - Abnormal; Notable for the following components:      Result Value   Color, Urine STRAW (*)    APPearance CLEAR (*)    All other components within normal limits  BASIC METABOLIC PANEL WITH GFR  CBC     EKG My EKG interpretation: Rate of 71, normal sinus rhythm, normal axis, normal intervals.  No acute ST elevations or depressions   RADIOLOGY MRI pending at time of signout  PROCEDURES:  Critical Care performed: No  Procedures   MEDICATIONS ORDERED IN ED: Medications  diazepam (VALIUM) tablet 2 mg (2 mg Oral Given 04/21/23 1503)     IMPRESSION / MDM / ASSESSMENT AND PLAN / ED COURSE  I reviewed the triage vital signs and the nursing notes.                              Differential diagnosis includes, but is not limited to, peripheral vertigo such as BPPV, vestibular neuritis, or Mnire's, or central vertigo cause like CVA  Patient's presentation is most consistent with acute complicated illness / injury requiring diagnostic workup.  Patient is a 63 year old female presenting today for intermittent dizziness and numbness sensation.  Symptoms not being relieved at home with meclizine however she has not taken it extensively.  Her exam from a neurological standpoint only really notable for bilateral  decrease in sensation to the lower part of her face.  No unilateral weakness or numbness.  Equivocal for nystagmus and dizziness with Dix-Hallpike.  Discussed with patient that this could likely be peripheral cause of vertigo which is more benign but given chronicity of symptoms without improvement on meclizine, may benefit from further evaluation with MRI.  She is agreeable to get MRI brain to rule out CVA.  Will try diazepam here for symptomatic relief.  Patient signed out to oncoming provider pending results of MRI completion of blood workup.  If negative, suspect she can be discharged with ongoing symptomatic care.  The patient is on the cardiac monitor to evaluate for evidence of arrhythmia and/or significant heart rate changes.     FINAL CLINICAL IMPRESSION(S) / ED DIAGNOSES   Final diagnoses:  Dizziness  Paresthesia     Rx / DC Orders   ED Discharge Orders     None        Note:  This document was prepared using Dragon voice recognition software and may include unintentional dictation errors.   Kandee Orion, MD 04/21/23 [phone]

## 2023-04-21 NOTE — ED Notes (Signed)
 Pt ambulated to restroom and back to bed without assistance, gait steady. Pt reports "not as bad" dizziness upon ambulation.   Felipe Horton, MD at bedside discussing results and POC at this time.

## 2023-04-21 NOTE — ED Provider Notes (Signed)
 Patient received in signout from Dr. Karlynn Oyster pending MRI.  Clinical Course as of 04/21/23 2002  Wed Apr 21, 2023  1550 Reassessed and discussed MRI results and recommendations for contrasted study.  Patient is quite anxious and does not want to go back into the machine.  We discussed the importance of this and how it would significantly change management.  Her son shortly arrived to the bedside and also explained this to him.  They are agreeable.  She has been stuck a few times for blood in the past, unable to get an IV.  We will likely have to use ultrasound. [DS]  2000 Reassessed.  Patient ambulating to and from the bathroom and reports feeling better after the Valium.  Reassuring contrasted MRI without signs of vestibular schwannoma.  She has a neurology appointment in about 6 weeks.  We discussed ENT follow-up.  We discussed ED return precautions, Valium at home.  Answered questions.  She is suitable for outpatient management [DS]    Clinical Course User Index [DS] Arline Bennett, MD      Arline Bennett, MD 04/21/23 2002
